# Patient Record
Sex: Male | Born: 1965
Health system: Southern US, Community
[De-identification: ages and names within clinical notes are randomized; demographics above are authoritative.]

## PROBLEM LIST (undated history)

## (undated) DIAGNOSIS — I10 Essential (primary) hypertension: Secondary | ICD-10-CM

## (undated) DIAGNOSIS — F988 Other specified behavioral and emotional disorders with onset usually occurring in childhood and adolescence: Secondary | ICD-10-CM

## (undated) HISTORY — PX: APPENDECTOMY: SHX54

## (undated) HISTORY — DX: Other specified behavioral and emotional disorders with onset usually occurring in childhood and adolescence: F98.8

## (undated) HISTORY — DX: Essential (primary) hypertension: I10

---

## 2003-05-26 ENCOUNTER — Ambulatory Visit (HOSPITAL_COMMUNITY): Admission: RE | Admit: 2003-05-26 | Discharge: 2003-05-26 | Payer: Self-pay | Admitting: Gastroenterology

## 2005-02-18 ENCOUNTER — Encounter: Admission: RE | Admit: 2005-02-18 | Discharge: 2005-02-18 | Payer: Self-pay | Admitting: Internal Medicine

## 2005-02-22 ENCOUNTER — Encounter: Admission: RE | Admit: 2005-02-22 | Discharge: 2005-02-22 | Payer: Self-pay | Admitting: Internal Medicine

## 2005-03-13 ENCOUNTER — Encounter: Admission: RE | Admit: 2005-03-13 | Discharge: 2005-04-02 | Payer: Self-pay | Admitting: Internal Medicine

## 2006-08-27 ENCOUNTER — Ambulatory Visit: Payer: Self-pay | Admitting: Pulmonary Disease

## 2008-05-02 ENCOUNTER — Ambulatory Visit: Payer: Self-pay | Admitting: Internal Medicine

## 2008-06-27 ENCOUNTER — Ambulatory Visit: Payer: Self-pay | Admitting: Internal Medicine

## 2008-07-11 ENCOUNTER — Ambulatory Visit: Payer: Self-pay | Admitting: Internal Medicine

## 2008-08-07 ENCOUNTER — Ambulatory Visit: Payer: Self-pay | Admitting: Internal Medicine

## 2008-09-25 ENCOUNTER — Ambulatory Visit: Payer: Self-pay | Admitting: Internal Medicine

## 2008-10-10 ENCOUNTER — Ambulatory Visit: Payer: Self-pay | Admitting: Internal Medicine

## 2009-03-15 ENCOUNTER — Ambulatory Visit: Payer: Self-pay | Admitting: Internal Medicine

## 2009-04-19 ENCOUNTER — Ambulatory Visit: Payer: Self-pay | Admitting: Internal Medicine

## 2009-10-11 ENCOUNTER — Ambulatory Visit: Payer: Self-pay | Admitting: Internal Medicine

## 2010-06-01 ENCOUNTER — Encounter: Payer: Self-pay | Admitting: Gastroenterology

## 2010-09-27 NOTE — Op Note (Signed)
NAME:  Dominic Garner, Dominic Garner                         ACCOUNT NO.:  [id number]   MEDICAL RECORD NO.:  [id number]                   PATIENT TYPE:  AMB   LOCATION:  ENDO                                 FACILITY:  [facility]   PHYSICIAN:  Anselmo Rod, M.D.               DATE OF BIRTH:  [birth year]   DATE OF PROCEDURE:  05/26/2003  DATE OF DISCHARGE:                                 OPERATIVE REPORT   PROCEDURE:  Colonoscopy.   ENDOSCOPIST:  Anselmo Rod, M.D.   INSTRUMENT USED:  Olympus video colonoscope.   INDICATIONS FOR PROCEDURE:  A 45 year old white male with a questionable  history of diverticulitis and inflammatory changes in the descending colon,  a CT to rule out colitis.   PREPROCEDURE PREPARATION:  Informed consent was procured from the patient.  The patient fasted for eight hours prior to the procedure and prepped with a  bottle of magnesium citrate and a gallon of GoLYTELY the night prior to the  procedure.   PREPROCEDURE PHYSICAL:  The patient had stable vital signs. Neck supple.  Chest clear to auscultation. S1, S2 regular. Abdomen soft with normal bowel  sounds.   DESCRIPTION OF PROCEDURE:  The patient was placed in the left lateral  decubitus position, sedated with 70 mg of Demerol and 7 mg of Versed  intravenously.  Once the patient was adequately sedated and maintained on  low flow oxygen and continuous cardiac monitoring, the Olympus video  colonoscope was advanced from the rectum to the cecum without difficulty. No  masses, polyps, erosions, ulcerations or diverticula were seen. The  appendiceal orifice and ileocecal valve were clearly visualized before we  did retroflexion in the rectum revealed no abnormalities.   IMPRESSION:  Normal colonoscopy of the cecum.  No masses or polyps seen. No  evidence of diverticulosis.   RECOMMENDATIONS:  1. Continue on high fiber diet with liberal fluid intake.  2. Repeat colorectal cancer screening at the age of 8.  3.  Outpatient followup as need arises in the future.                                               Anselmo Rod, M.D.    JNM/MEDQ  D:  05/26/2003  T:  05/26/2003  Job:  [id number]

## 2010-09-27 NOTE — Assessment & Plan Note (Signed)
[facility]                             PULMONARY OFFICE NOTE   NAME:Stopa, Dominic Garner                      MRN:          [id number]  DATE:08/27/2006                            DOB:          01-Jun-1965    SLEEP MEDICINE CONSULTATION   HISTORY OF PRESENT ILLNESS:  The patient is a very pleasant 45 year old  gentleman whom I have been asked to see for snoring and possible sleep  apnea.  The patient states that his wife has complained of snoring and  pauses in his breathing during sleep.  The patient typically gets to bed  between 10:30 and 11:30, and gets up at 7 to 8 a.m. to start his day.  He feels very rested upon arising.  The patient works as an Camera operator  and denies any alertness issues during the day.  He denies any  sleepiness with TV or movies, and no sleepiness with driving.  He is  very satisfied with his degree of alertness.  However, all of this could  be somewhat masked by him taking Adderall for ADD.  The patient states  that his weight is up about 5 pounds over the last 2 years.   PAST MEDICAL HISTORY:  Significant for appendectomy in 1987.  Otherwise,  is unremarkable.   CURRENT MEDICATIONS:  1. Adderall 20 mg 1 daily and p.r.n.  2. Multivitamin daily.  3. Prilosec over-the-counter daily p.r.n.   The patient is allergic to PENICILLIN.   SOCIAL HISTORY:  He is married.  He does not smoke.   FAMILY HISTORY:  Noncontributory in first degree relatives.   REVIEW OF SYSTEMS:  As per the history of present illness.  Also see  patient intake form documented in the chart.   PHYSICAL EXAM:  GENERAL:  He is an overweight male in no acute distress.  Blood pressure is 116/80, pulse 103, temperature 97.9, weight is 239  pounds.  He is 6 feet tall.  O2 saturation on room air is 96%.  HEENT:  Pupils are equal, round, and reactive to light and  accommodation.  Extraocular muscles are intact.  Nares show mild septal  deviation to the left  with no purulence.  Oropharynx does show a very  large left tonsil with a normal right and moderate elongation of the  soft palate and uvula.  NECK:  Supple without JVD or lymphadenopathy.  There is no palpable  thyromegaly.  CHEST:  Totally clear.  CARDIAC:  Regular rate and rhythm.  No murmurs, rubs, or gallops.  ABDOMEN:  Soft and nontender.  Nondistended.  Good bowel sounds.  GENITAL, RECTAL, AND BREASTS:  Not done and not indicated.  LOWER EXTREMITIES:  Without edema.  Pulses intact distally.  NEUROLOGIC:  Alert and oriented with no obvious motor deficits.   IMPRESSION:  Snoring versus the possibility of obstructive sleep apnea.  I suspect from the patient's history, even if he does have sleep apnea,  it is probably extremely mild, and therefore, not a major cardiovascular  risk factor for him.  The patient also has no underlying comorbid  diseases.  I have  gone over the difference with the patient between  snoring and sleep apnea, and how with treat the various entities.  I  really think, at this point in time, because he is not overly  symptomatic during the day, that he should work aggressively on weight  loss, and lets take the next 6 months to see how his symptoms respond to  this.  If his wife feels very strongly about his snoring and wishes  something to be done at this moment, the patient can consider  tonsillectomy with uvulopalatopharyngoplasty and possible nasal septal  reconstruction.  The patient will discuss this with his wife and get  some idea as to the urgency of her complaint with regard to his snoring.  Overall, he feels like he would like to take the more conservative  approach.   PLAN:  1. Work aggressively on weight loss over the next 6 months and stay      off his back as much as possible.  2. The patient is to call me if his wife is really complaining about      the snoring, and he wants to pursue a more aggressive course.  3. The patient is to certainly  call me if he has increasing symptoms      with regard to how he is resting and how he feels the next day.  4. The patient is having a lot of nasal and throat symptoms related to      a recent possible sinus infection versus significant rhinitis.  At      this point in time, I would like to work on nasal hygiene and see      if this will help him as well.  We will go ahead and start Veramyst      2 sprays in each nostril b.i.d. initially, and then bring down to      daily p.r.n.  The patient will also try over-the-counter Zyrtec 1      tablet at bedtime to see how things go.     Barbaraann Share, MD,FCCP  Electronically Signed    [facility]/MedQ  DD: 08/27/2006  DT: 08/27/2006  Job #: [id number]   cc:   Luanna Cole. Lenord Fellers, M.D.

## 2011-05-02 ENCOUNTER — Encounter: Payer: Self-pay | Admitting: Internal Medicine

## 2011-05-02 ENCOUNTER — Ambulatory Visit (INDEPENDENT_AMBULATORY_CARE_PROVIDER_SITE_OTHER): Payer: 59 | Admitting: Internal Medicine

## 2011-05-02 VITALS — BP 124/94 | HR 84 | Temp 97.2°F | Wt 244.0 lb

## 2011-05-02 DIAGNOSIS — Z23 Encounter for immunization: Secondary | ICD-10-CM

## 2011-05-02 DIAGNOSIS — I10 Essential (primary) hypertension: Secondary | ICD-10-CM

## 2011-05-02 DIAGNOSIS — S335XXA Sprain of ligaments of lumbar spine, initial encounter: Secondary | ICD-10-CM

## 2011-05-02 NOTE — Progress Notes (Signed)
  Subjective:    Patient ID: Dominic Garner, male    DOB: Apr 03, 1966, 45 y.o.   MRN: [id number]  HPI patient awakened this morning with back pain so severe that he really could not get out of bed. His wife who is had breast cancer had some dialogue it and Robaxin which she gave him. After taking those 2 medications he was able to get up and get out of bed but he is moving very slowly. Doesn't recall any heavy lifting but does work as an Public relations account executive at [employer]. Has had back issues in the past. Has seen chiropractor in the past. Says pain is mostly in bilateral lumbar area worse on the left than the right. No radiculopathy.  Also has history of hypertension but has not been taking antihypertensive medication. Have advised him to purchase home blood pressure cuff with large cuff and keep some readings. Can return in 4 weeks or so. Probably is due for physical examination.  Has not had influenza immunization. He received one today.      Review of Systems     Objective:   Physical Exam straight leg raising at 90 shows a considerable amount of spasm but no pain in the lumbar area with raising legs at 90 bilaterally. Deep tendon reflexes 1+ and symmetrical in the knees. Muscle strength is 5 over 5 in the lower extremities.        Assessment & Plan:  Low back strain  History of hypertension now off antihypertensive medication  Plan: He will purchase home blood pressure monitor and watch his blood pressure at home for several weeks. Prescribed Sterapred DS 10 mg 6 day dosepak. Flexeril 10 mg 1/2-1 by mouth each bedtime #30 with refills. Lorcet 10/650 (#60) 1 by mouth every 6 hours when necessary pain no refill

## 2011-05-02 NOTE — Patient Instructions (Signed)
Take Flexeril at bedtime for muscle spasm in back. Start on prednisone dosepak for inflammation and lower back. Take pain medication sparingly for as needed. Monitor your blood pressure at home. Return in 4 weeks for followup on hypertension.

## 2011-06-10 ENCOUNTER — Other Ambulatory Visit: Payer: Self-pay | Admitting: Internal Medicine

## 2011-06-10 ENCOUNTER — Other Ambulatory Visit: Payer: 59 | Admitting: Internal Medicine

## 2011-06-10 DIAGNOSIS — Z0000 Encounter for general adult medical examination without abnormal findings: Secondary | ICD-10-CM

## 2011-06-10 LAB — COMPREHENSIVE METABOLIC PANEL
ALT: 28 U/L (ref 0–53)
AST: 24 U/L (ref 0–37)
Albumin: 4.6 g/dL (ref 3.5–5.2)
Alkaline Phosphatase: 48 U/L (ref 39–117)
BUN: 16 mg/dL (ref 6–23)
CO2: 23 mEq/L (ref 19–32)
Calcium: 9.3 mg/dL (ref 8.4–10.5)
Chloride: 104 mEq/L (ref 96–112)
Creat: 0.9 mg/dL (ref 0.50–1.35)
Glucose, Bld: 117 mg/dL — ABNORMAL HIGH (ref 70–99)
Potassium: 4.7 mEq/L (ref 3.5–5.3)
Sodium: 139 mEq/L (ref 135–145)
Total Bilirubin: 1.1 mg/dL (ref 0.3–1.2)
Total Protein: 7.3 g/dL (ref 6.0–8.3)

## 2011-06-10 LAB — CBC WITH DIFFERENTIAL/PLATELET
Basophils Absolute: 0.1 10*3/uL (ref 0.0–0.1)
Basophils Relative: 1 % (ref 0–1)
Eosinophils Absolute: 0.1 10*3/uL (ref 0.0–0.7)
Eosinophils Relative: 2 % (ref 0–5)
HCT: 45.1 % (ref 39.0–52.0)
Hemoglobin: 15 g/dL (ref 13.0–17.0)
Lymphocytes Relative: 44 % (ref 12–46)
Lymphs Abs: 2.6 10*3/uL (ref 0.7–4.0)
MCH: 29.8 pg (ref 26.0–34.0)
MCHC: 33.3 g/dL (ref 30.0–36.0)
MCV: 89.5 fL (ref 78.0–100.0)
Monocytes Absolute: 0.6 10*3/uL (ref 0.1–1.0)
Monocytes Relative: 10 % (ref 3–12)
Neutro Abs: 2.6 10*3/uL (ref 1.7–7.7)
Neutrophils Relative %: 44 % (ref 43–77)
Platelets: 334 10*3/uL (ref 150–400)
RBC: 5.04 MIL/uL (ref 4.22–5.81)
RDW: 14.2 % (ref 11.5–15.5)
WBC: 5.9 10*3/uL (ref 4.0–10.5)

## 2011-06-10 LAB — LIPID PANEL
Cholesterol: 166 mg/dL (ref 0–200)
HDL: 46 mg/dL (ref >39)
LDL Cholesterol: 106 mg/dL — ABNORMAL HIGH (ref 0–99)
Total CHOL/HDL Ratio: 3.6 Ratio
Triglycerides: 72 mg/dL (ref <150)
VLDL: 14 mg/dL (ref 0–40)

## 2011-06-12 ENCOUNTER — Ambulatory Visit (INDEPENDENT_AMBULATORY_CARE_PROVIDER_SITE_OTHER): Payer: 59 | Admitting: Internal Medicine

## 2011-06-12 ENCOUNTER — Encounter: Payer: Self-pay | Admitting: Internal Medicine

## 2011-06-12 VITALS — BP 134/86 | HR 100 | Temp 97.8°F | Ht 71.5 in | Wt 247.0 lb

## 2011-06-12 DIAGNOSIS — I10 Essential (primary) hypertension: Secondary | ICD-10-CM

## 2011-06-12 DIAGNOSIS — Z0000 Encounter for general adult medical examination without abnormal findings: Secondary | ICD-10-CM

## 2011-06-12 DIAGNOSIS — M545 Low back pain, unspecified: Secondary | ICD-10-CM

## 2011-06-12 DIAGNOSIS — E669 Obesity, unspecified: Secondary | ICD-10-CM

## 2011-06-12 LAB — POCT URINALYSIS DIPSTICK
Bilirubin, UA: NEGATIVE
Blood, UA: NEGATIVE
Glucose, UA: NEGATIVE
Ketones, UA: NEGATIVE
Leukocytes, UA: NEGATIVE
Nitrite, UA: NEGATIVE
Protein, UA: NEGATIVE
Spec Grav, UA: 1.025
Urobilinogen, UA: NEGATIVE
pH, UA: 6

## 2011-06-12 LAB — HEMOGLOBIN A1C
Hgb A1c MFr Bld: 5.9 % — ABNORMAL HIGH (ref <5.7)
Mean Plasma Glucose: 123 mg/dL — ABNORMAL HIGH (ref <117)

## 2011-06-12 LAB — PSA: PSA: 0.59 ng/mL (ref <=4.00)

## 2011-08-10 ENCOUNTER — Encounter: Payer: Self-pay | Admitting: Internal Medicine

## 2011-08-10 DIAGNOSIS — M545 Low back pain, unspecified: Secondary | ICD-10-CM | POA: Insufficient documentation

## 2011-08-10 NOTE — Patient Instructions (Signed)
Please take [place] real 10 mg daily on a regular basis. Return in 6 months.

## 2011-08-10 NOTE — Progress Notes (Signed)
  Subjective:    Patient ID: Dominic Garner, male    DOB: April 01, 1966, 46 y.o.   MRN: [id number]  HPIPleasant 41 her old white male with history of hypertension and recurrent low back pain as well as some attention deficit issues. He's here for health maintenance exam. He had stopped taking Altace for while but blood pressure became elevated once again particularly when wife was diagnosed with recurrent left breast cancer. He is a bit overweight. Needs to diet exercise and lose weight. Had an appendectomy in the 1980s. Influenza immunization given December 2012 when he had an episode of severe back pain. This was mainly spasm. Has not thought to have lumbar disc disease. Penicillin causes a rash.  Family history father with history of kidney stones. Mother and 2 sisters in good health. No children.   Review of Systems noncontributory except for hypertension and recurrent low back pain     Objective:   Physical Exam  Vitals reviewed. Constitutional: He is oriented to person, place, and time. He appears well-developed and well-nourished. No distress.  HENT:  Head: Normocephalic and atraumatic.  Right Ear: External ear normal.  Left Ear: External ear normal.  Mouth/Throat: Oropharynx is clear and moist. No oropharyngeal exudate.  Eyes: Conjunctivae are normal. Pupils are equal, round, and reactive to light. Right eye exhibits no discharge. Left eye exhibits no discharge. No scleral icterus.  Neck: Neck supple. No JVD present. No thyromegaly present.  Cardiovascular: Normal rate, regular rhythm, normal heart sounds and intact distal pulses.   No murmur heard. Pulmonary/Chest: Effort normal and breath sounds normal. He has no wheezes. He has no rales.  Abdominal: He exhibits no distension and no mass. There is no tenderness. There is no rebound and no guarding.  Genitourinary: Rectum normal and prostate normal.  Musculoskeletal: Normal range of motion. He exhibits no edema.       No back pain  at present.  Lymphadenopathy:    He has no cervical adenopathy.  Neurological: He is alert and oriented to person, place, and time. He has normal reflexes. He displays normal reflexes. No cranial nerve deficit. Coordination normal.  Skin: Skin is warm and dry. No rash noted. He is not diaphoretic.  Psychiatric: He has a normal mood and affect. His behavior is normal. Judgment and thought content normal.          Assessment & Plan:  Hypertension  Obesity  Recurrent back pain  Plan: Return in 6 months

## 2012-01-02 ENCOUNTER — Encounter: Payer: Self-pay | Admitting: Internal Medicine

## 2012-01-02 ENCOUNTER — Ambulatory Visit (INDEPENDENT_AMBULATORY_CARE_PROVIDER_SITE_OTHER): Payer: BC Managed Care – PPO | Admitting: Internal Medicine

## 2012-01-02 VITALS — BP 128/90 | HR 72 | Temp 97.7°F | Wt 244.5 lb

## 2012-01-02 DIAGNOSIS — R7309 Other abnormal glucose: Secondary | ICD-10-CM

## 2012-01-02 DIAGNOSIS — R635 Abnormal weight gain: Secondary | ICD-10-CM

## 2012-01-02 DIAGNOSIS — R7302 Impaired glucose tolerance (oral): Secondary | ICD-10-CM

## 2012-01-02 DIAGNOSIS — E669 Obesity, unspecified: Secondary | ICD-10-CM

## 2012-01-02 DIAGNOSIS — R7301 Impaired fasting glucose: Secondary | ICD-10-CM

## 2012-01-02 DIAGNOSIS — I10 Essential (primary) hypertension: Secondary | ICD-10-CM

## 2012-01-02 LAB — BASIC METABOLIC PANEL
BUN: 16 mg/dL (ref 6–23)
CO2: 25 mEq/L (ref 19–32)
Calcium: 9.3 mg/dL (ref 8.4–10.5)
Chloride: 105 mEq/L (ref 96–112)
Creat: 0.89 mg/dL (ref 0.50–1.35)
Glucose, Bld: 107 mg/dL — ABNORMAL HIGH (ref 70–99)
Potassium: 4.6 mEq/L (ref 3.5–5.3)
Sodium: 141 mEq/L (ref 135–145)

## 2012-01-02 LAB — HEMOGLOBIN A1C
Hgb A1c MFr Bld: 5.7 % — ABNORMAL HIGH (ref <5.7)
Mean Plasma Glucose: 117 mg/dL — ABNORMAL HIGH (ref <117)

## 2012-01-02 LAB — TSH: TSH: 1.607 u[IU]/mL (ref 0.350–4.500)

## 2012-01-10 DIAGNOSIS — E669 Obesity, unspecified: Secondary | ICD-10-CM | POA: Insufficient documentation

## 2012-01-10 DIAGNOSIS — R7302 Impaired glucose tolerance (oral): Secondary | ICD-10-CM | POA: Insufficient documentation

## 2012-01-10 NOTE — Patient Instructions (Addendum)
Continue Altace for hypertension. Watch diet more strictly and try to get exercise. Try to lose 20 pounds in the next 6 months. Hemoglobin A1c checked today for impaired glucose tolerance history

## 2012-01-10 NOTE — Progress Notes (Signed)
  Subjective:    Patient ID: Dominic Garner, male    DOB: 1966/03/28, 46 y.o.   MRN: [id number]  HPI 46 year old white male in today for six-month followup on hypertension and impaired glucose tolerance. Currently glucose intolerance is being treated by diet. Doesn't get much exercise. He enjoys woodworking and makes some beautiful vases from wood. Wife has history of breast cancer and that has been stressful.   She chose to have unaffected breast removed recently because she did not want to take a chance on having cancer in that breast having had recurrence of cancer in one breast. She also had reconstructive surgery. 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  No side effects with Altace.    Review of Systems     Objective:   Physical Exam obese white male in no acute distress. Chest clear to auscultation. Neck is supple without thyromegaly or carotid bruits. Cardiac exam: Regular rate and rhythm normal S1 and S2; extremities without edema.        Assessment & Plan:  Obesity-encouraged diet and exercise. Suggested patient lose 20 pounds in the next 6 months  Impaired glucose tolerance-hemoglobin A1c checked today  Hypertension-stable on Altace  Situational stress  Plan: Return in 6 months for physical examination. Continue ACE inhibitor for hypertension.

## 2012-03-11 ENCOUNTER — Other Ambulatory Visit: Payer: Self-pay

## 2012-03-11 MED ORDER — RAMIPRIL 10 MG PO TABS: 10.0000 mg | ORAL_TABLET | Freq: Every day | ORAL | Status: DC

## 2012-03-12 ENCOUNTER — Telehealth: Payer: Self-pay | Admitting: Internal Medicine

## 2012-03-12 DIAGNOSIS — J069 Acute upper respiratory infection, unspecified: Secondary | ICD-10-CM

## 2012-03-12 NOTE — Telephone Encounter (Signed)
Dominic Garner is here today with an upper respiratory infection. Husband has similar illness. [name] says that he is reluctant to come to the office today. Written prescription for him for Zithromax Z-Pak take 2 tablets day one followed by 1 tablet days 2 through 5. He is allergic to penicillin supposedly.

## 2012-03-16 ENCOUNTER — Telehealth: Payer: Self-pay | Admitting: Internal Medicine

## 2012-03-16 NOTE — Telephone Encounter (Signed)
Erroneous telephone entry. No message, no call.

## 2012-06-15 ENCOUNTER — Other Ambulatory Visit: Payer: BC Managed Care – PPO | Admitting: Internal Medicine

## 2012-06-15 ENCOUNTER — Encounter: Payer: BC Managed Care – PPO | Admitting: Internal Medicine

## 2012-06-15 DIAGNOSIS — R7301 Impaired fasting glucose: Secondary | ICD-10-CM

## 2012-06-15 DIAGNOSIS — Z0000 Encounter for general adult medical examination without abnormal findings: Secondary | ICD-10-CM

## 2012-06-15 LAB — COMPREHENSIVE METABOLIC PANEL
ALT: 24 U/L (ref 0–53)
AST: 20 U/L (ref 0–37)
Albumin: 4.4 g/dL (ref 3.5–5.2)
Alkaline Phosphatase: 36 U/L — ABNORMAL LOW (ref 39–117)
BUN: 10 mg/dL (ref 6–23)
CO2: 30 mEq/L (ref 19–32)
Calcium: 9.5 mg/dL (ref 8.4–10.5)
Chloride: 103 mEq/L (ref 96–112)
Creat: 0.96 mg/dL (ref 0.50–1.35)
Glucose, Bld: 117 mg/dL — ABNORMAL HIGH (ref 70–99)
Potassium: 4.7 mEq/L (ref 3.5–5.3)
Sodium: 139 mEq/L (ref 135–145)
Total Bilirubin: 1.2 mg/dL (ref 0.3–1.2)
Total Protein: 7.5 g/dL (ref 6.0–8.3)

## 2012-06-15 LAB — CBC WITH DIFFERENTIAL/PLATELET
Basophils Absolute: 0.1 10*3/uL (ref 0.0–0.1)
Basophils Relative: 1 % (ref 0–1)
Eosinophils Absolute: 0.1 10*3/uL (ref 0.0–0.7)
Eosinophils Relative: 2 % (ref 0–5)
HCT: 45.6 % (ref 39.0–52.0)
Hemoglobin: 15.6 g/dL (ref 13.0–17.0)
Lymphocytes Relative: 43 % (ref 12–46)
Lymphs Abs: 2.3 10*3/uL (ref 0.7–4.0)
MCH: 30 pg (ref 26.0–34.0)
MCHC: 34.2 g/dL (ref 30.0–36.0)
MCV: 87.7 fL (ref 78.0–100.0)
Monocytes Absolute: 0.5 10*3/uL (ref 0.1–1.0)
Monocytes Relative: 10 % (ref 3–12)
Neutro Abs: 2.4 10*3/uL (ref 1.7–7.7)
Neutrophils Relative %: 44 % (ref 43–77)
Platelets: 257 10*3/uL (ref 150–400)
RBC: 5.2 MIL/uL (ref 4.22–5.81)
RDW: 13.4 % (ref 11.5–15.5)
WBC: 5.3 10*3/uL (ref 4.0–10.5)

## 2012-06-15 LAB — HEMOGLOBIN A1C
Hgb A1c MFr Bld: 5.8 % — ABNORMAL HIGH (ref <5.7)
Mean Plasma Glucose: 120 mg/dL — ABNORMAL HIGH (ref <117)

## 2012-06-15 LAB — LIPID PANEL
Cholesterol: 178 mg/dL (ref 0–200)
HDL: 46 mg/dL (ref >39)
LDL Cholesterol: 113 mg/dL — ABNORMAL HIGH (ref 0–99)
Total CHOL/HDL Ratio: 3.9 Ratio
Triglycerides: 96 mg/dL (ref <150)
VLDL: 19 mg/dL (ref 0–40)

## 2012-06-15 LAB — PSA: PSA: 0.7 ng/mL (ref <=4.00)

## 2012-07-12 ENCOUNTER — Encounter: Payer: Self-pay | Admitting: Internal Medicine

## 2012-07-12 ENCOUNTER — Ambulatory Visit (INDEPENDENT_AMBULATORY_CARE_PROVIDER_SITE_OTHER): Payer: BC Managed Care – PPO | Admitting: Internal Medicine

## 2012-07-12 VITALS — BP 136/90 | HR 84 | Temp 97.4°F | Ht 72.0 in | Wt 246.0 lb

## 2012-07-12 DIAGNOSIS — R7302 Impaired glucose tolerance (oral): Secondary | ICD-10-CM

## 2012-07-12 DIAGNOSIS — I10 Essential (primary) hypertension: Secondary | ICD-10-CM

## 2012-07-12 DIAGNOSIS — Z0000 Encounter for general adult medical examination without abnormal findings: Secondary | ICD-10-CM

## 2012-07-12 DIAGNOSIS — Z8739 Personal history of other diseases of the musculoskeletal system and connective tissue: Secondary | ICD-10-CM

## 2012-07-12 DIAGNOSIS — E8881 Metabolic syndrome: Secondary | ICD-10-CM

## 2012-07-12 DIAGNOSIS — R7309 Other abnormal glucose: Secondary | ICD-10-CM

## 2012-07-12 DIAGNOSIS — E669 Obesity, unspecified: Secondary | ICD-10-CM

## 2012-07-12 LAB — POCT URINALYSIS DIPSTICK
Bilirubin, UA: NEGATIVE
Blood, UA: NEGATIVE
Glucose, UA: NEGATIVE
Ketones, UA: NEGATIVE
Leukocytes, UA: NEGATIVE
Nitrite, UA: NEGATIVE
Protein, UA: NEGATIVE
Spec Grav, UA: 1.02
Urobilinogen, UA: NEGATIVE
pH, UA: 6

## 2012-07-13 DIAGNOSIS — E8881 Metabolic syndrome: Secondary | ICD-10-CM | POA: Insufficient documentation

## 2012-07-13 NOTE — Patient Instructions (Addendum)
We will arrange for you to see dietitian. Return in 6 months. In the meantime diet exercise and lose weight

## 2012-07-13 NOTE — Progress Notes (Signed)
  Subjective:    Patient ID: Dominic Garner, male    DOB: Mar 27, 1966, 47 y.o.   MRN: [id number]  HPI 47 year old white male with history of hypertension and obesity in today for health maintenance and evaluation of medical problems. History of low back pain from time to time. Patient is overweight at 246 pounds. He has not lost any weight in the past 2 years. He really doesn't exercise regularly. He enjoys woodworking. He does take some Prilosec over-the-counter for GE reflux. His been on Ramipril for some time for hypertension. Hemoglobin A1c is 5.8% in one year ago was 5.9%. PSA is within normal limits. Family history of prostate cancer in his grandfather. LDL cholesterol was 113 and was 106 last year. He really needs to get serious about diet and exercise. He is willing to see a nutritionist.  He had an appendectomy in the 1980s. Penicillin causes a rash.  Family history: Father with history of kidney stones. Mother and 2 sisters in good health. No children.  Does not smoke. Occasional social alcohol consumption.    Review of Systems noncontributory except for hypertension and low back pain     Objective:   Physical Exam  Vitals reviewed. Constitutional: He is oriented to person, place, and time. No distress.  HENT:  Head: Normocephalic and atraumatic.  Right Ear: External ear normal.  Left Ear: External ear normal.  Mouth/Throat: Oropharynx is clear and moist. No oropharyngeal exudate.  Eyes: Conjunctivae and EOM are normal. Pupils are equal, round, and reactive to light. Right eye exhibits no discharge. Left eye exhibits no discharge. No scleral icterus.  Neck: Neck supple. No JVD present. No thyromegaly present.  Cardiovascular: Normal rate, regular rhythm, normal heart sounds and intact distal pulses.   No murmur heard. Pulmonary/Chest: He has no wheezes.  Abdominal: Soft. Bowel sounds are normal. He exhibits no distension and no mass. There is no tenderness. There is no rebound  and no guarding.  Genitourinary: Prostate normal.  Musculoskeletal: He exhibits no edema.  Lymphadenopathy:    He has no cervical adenopathy.  Neurological: He is alert and oriented to person, place, and time. He has normal reflexes. No cranial nerve deficit.  Skin: Skin is warm and dry. No rash noted. He is not diaphoretic.  Psychiatric: He has a normal mood and affect. His behavior is normal. Judgment and thought content normal.          Assessment & Plan:  Hypertension  Obesity  Impaired glucose tolerance  History of low back pain  Plan: Encouraged diet and exercise. Nutrition consult. Return in 6 months for office visit, blood pressure check, and hemoglobin A1c

## 2012-11-18 ENCOUNTER — Other Ambulatory Visit: Payer: BC Managed Care – PPO | Admitting: Internal Medicine

## 2012-11-18 DIAGNOSIS — R7301 Impaired fasting glucose: Secondary | ICD-10-CM

## 2012-11-18 LAB — HEMOGLOBIN A1C
Hgb A1c MFr Bld: 5.5 % (ref <5.7)
Mean Plasma Glucose: 111 mg/dL (ref <117)

## 2012-11-22 ENCOUNTER — Encounter: Payer: Self-pay | Admitting: Internal Medicine

## 2012-11-22 ENCOUNTER — Ambulatory Visit (INDEPENDENT_AMBULATORY_CARE_PROVIDER_SITE_OTHER): Payer: BC Managed Care – PPO | Admitting: Internal Medicine

## 2012-11-22 VITALS — BP 144/90 | HR 80 | Temp 97.7°F | Wt 243.0 lb

## 2012-11-22 DIAGNOSIS — I10 Essential (primary) hypertension: Secondary | ICD-10-CM

## 2012-11-22 DIAGNOSIS — R7309 Other abnormal glucose: Secondary | ICD-10-CM

## 2012-11-22 DIAGNOSIS — R7302 Impaired glucose tolerance (oral): Secondary | ICD-10-CM

## 2012-11-22 NOTE — Patient Instructions (Addendum)
Take Altace 5 mg daily and return in 4 weeks ---continue diet exercise

## 2012-11-22 NOTE — Progress Notes (Signed)
  Subjective:    Patient ID: Dominic Garner, male    DOB: 1965-09-29, 47 y.o.   MRN: [id number]  HPI    In today to followup on hypertension and glucose intolerance. Hemoglobin A1c has decreased 5.5% with diet and exercise alone. She's lost 3 pounds since March. He's been exercising more and watching his diet. He's riding his bicycle 11 miles at a time and running 3 miles at a time.  However, he quit taking antihypertensive medication because he thought his blood pressure had normalized. Gets up today. Last week he was in [place] taking care of sick parents. Mother  had pancreatitis and father had kidney stones.  He is afraid that if he goes back to Altace 10 mg daily blood pressure will lead to lobe. Therefore we've agreed he will take 5 mg Altace daily. He has no side effects with this medication.   Review of Systems     Objective:   Physical Exam blood pressure 140/90 today   Not examined. Hemoglobin A1c reviewed with him. Previous 11 A1c a year ago was 5.9% so he's made considerable progress bring it down 5.5%     Assessment & Plan:  Impaired glucose tolerance  Hypertension  Plan: Start Altace 5 mg daily and return in 4 weeks

## 2012-11-30 ENCOUNTER — Other Ambulatory Visit: Payer: Self-pay

## 2012-11-30 MED ORDER — RAMIPRIL 10 MG PO TABS: 10.0000 mg | ORAL_TABLET | Freq: Every day | ORAL | Status: DC

## 2012-12-27 ENCOUNTER — Encounter: Payer: Self-pay | Admitting: Internal Medicine

## 2012-12-27 ENCOUNTER — Ambulatory Visit (INDEPENDENT_AMBULATORY_CARE_PROVIDER_SITE_OTHER): Payer: BC Managed Care – PPO | Admitting: Internal Medicine

## 2012-12-27 VITALS — BP 128/86 | HR 80 | Wt 242.0 lb

## 2012-12-27 DIAGNOSIS — Z87898 Personal history of other specified conditions: Secondary | ICD-10-CM

## 2012-12-27 DIAGNOSIS — E669 Obesity, unspecified: Secondary | ICD-10-CM

## 2012-12-27 DIAGNOSIS — I10 Essential (primary) hypertension: Secondary | ICD-10-CM

## 2012-12-27 DIAGNOSIS — Z862 Personal history of diseases of the blood and blood-forming organs and certain disorders involving the immune mechanism: Secondary | ICD-10-CM

## 2012-12-27 LAB — BASIC METABOLIC PANEL
BUN: 14 mg/dL (ref 6–23)
CO2: 29 mEq/L (ref 19–32)
Calcium: 9.6 mg/dL (ref 8.4–10.5)
Chloride: 101 mEq/L (ref 96–112)
Creat: 0.93 mg/dL (ref 0.50–1.35)
Glucose, Bld: 113 mg/dL — ABNORMAL HIGH (ref 70–99)
Potassium: 4.7 mEq/L (ref 3.5–5.3)
Sodium: 137 mEq/L (ref 135–145)

## 2012-12-27 NOTE — Progress Notes (Signed)
  Subjective:    Patient ID: Dominic Garner, male    DOB: October 11, 1965, 47 y.o.   MRN: [id number]  HPI  In today to followup on hypertension. At last visit blood pressure was not well controlled. He has not been very compliant with Altace 10 mg daily at that time. Note says that week gave him Altase 5 mg daily at the last visit but apparently his been taking 10 mg daily according to medication orders. His blood pressure is quite good today. He has not been able to exercise much recently because he pulled left calf muscle while exercising.  Hemoglobin A1c recently checked was 5.5%. He has a history of impaired glucose tolerance. He needs to regular diet exercise and lose weight.     Review of Systems     Objective:   Physical Exam  chest clear to auscultation. Cardiac exam regular rate and rhythm normal S1 and S2. Extremities without edema.       Assessment & Plan:   hypertension  History  This tolerance  Obesity  Plan: Continue Altace 10 mg daily. Basic metabolic panel drawn today. He will continue to monitor his blood pressure at home. He will return in mid March 2015 for physical exam. He will call if blood pressures not well controlled.

## 2012-12-27 NOTE — Patient Instructions (Addendum)
Continue Altace 10 mg daily. Basic metabolic panel drawn today. Return in mid March 2015 for physical exam. Continue to monitor your blood pressure at home . Continue diet and exercise.

## 2013-01-03 ENCOUNTER — Telehealth: Payer: Self-pay | Admitting: Internal Medicine

## 2013-01-03 DIAGNOSIS — F411 Generalized anxiety disorder: Secondary | ICD-10-CM

## 2013-01-03 DIAGNOSIS — Z0289 Encounter for other administrative examinations: Secondary | ICD-10-CM

## 2013-01-03 MED ORDER — CIPROFLOXACIN HCL 500 MG PO TABS: 500.0000 mg | ORAL_TABLET | Freq: Two times a day (BID) | ORAL | Status: DC

## 2013-01-03 MED ORDER — ALPRAZOLAM 0.5 MG PO TBDP: 0.5000 mg | ORAL_TABLET | Freq: Two times a day (BID) | ORAL | Status: DC | PRN

## 2013-01-03 NOTE — Telephone Encounter (Signed)
Also has requested antibiotics for trip. Call in Cipro 500 mg bid x 10 days for infection. Call in Xanax 0.5 mg 1/2 to one po bid prn anxiety # 60 no refill.

## 2013-01-03 NOTE — Telephone Encounter (Signed)
rx sent to pharmacy

## 2013-05-27 ENCOUNTER — Emergency Department (HOSPITAL_COMMUNITY)
Admission: EM | Admit: 2013-05-27 | Discharge: 2013-05-27 | Disposition: A | Discharge status: Home / Self Care | Payer: BC Managed Care – PPO | Attending: Emergency Medicine | Admitting: Emergency Medicine

## 2013-05-27 ENCOUNTER — Encounter (HOSPITAL_COMMUNITY): Payer: Self-pay | Admitting: Emergency Medicine

## 2013-05-27 DIAGNOSIS — W460XXA Contact with hypodermic needle, initial encounter: Secondary | ICD-10-CM | POA: Insufficient documentation

## 2013-05-27 DIAGNOSIS — S61209A Unspecified open wound of unspecified finger without damage to nail, initial encounter: Secondary | ICD-10-CM | POA: Insufficient documentation

## 2013-05-27 DIAGNOSIS — IMO0002 Reserved for concepts with insufficient information to code with codable children: Secondary | ICD-10-CM

## 2013-05-27 DIAGNOSIS — Y9389 Activity, other specified: Secondary | ICD-10-CM | POA: Insufficient documentation

## 2013-05-27 DIAGNOSIS — Z79899 Other long term (current) drug therapy: Secondary | ICD-10-CM | POA: Insufficient documentation

## 2013-05-27 DIAGNOSIS — I10 Essential (primary) hypertension: Secondary | ICD-10-CM | POA: Insufficient documentation

## 2013-05-27 DIAGNOSIS — Z880 Allergy status to penicillin: Secondary | ICD-10-CM | POA: Insufficient documentation

## 2013-05-27 DIAGNOSIS — Z8659 Personal history of other mental and behavioral disorders: Secondary | ICD-10-CM | POA: Insufficient documentation

## 2013-05-27 DIAGNOSIS — Y990 Civilian activity done for income or pay: Secondary | ICD-10-CM | POA: Insufficient documentation

## 2013-05-27 DIAGNOSIS — Y9289 Other specified places as the place of occurrence of the external cause: Secondary | ICD-10-CM | POA: Insufficient documentation

## 2013-05-27 LAB — HIV RAPID SCREEN (BLD OR BODY FLD EXPOSURE): Rapid HIV Screen: NONREACTIVE

## 2013-05-27 NOTE — Discharge Instructions (Signed)
Follow up with your doctor in 6 weeks to be rechecked for HIV and hepatitis.  Return to ER if you have any more concerns.

## 2013-05-27 NOTE — ED Provider Notes (Signed)
CSN: [id number]     Arrival date & time 05/27/13  1904 History  This chart was scribed for Ruby Colaatherine Shaddai Shapley, PA, working with Gavin PoundMichael Y. Oletta LamasGhim, MD, by Ardelia Memsylan Malpass ED Scribe. This patient was seen in room WTR7/WTR7 and the patient's care was started at 9:16 PM.    Chief Complaint  Patient presents with  . Body Fluid Exposure    The history is provided by the patient. No language interpreter was used.    HPI Comments: Dominic Garner is a 48 y.o. male with a history of HTN who presents to the Emergency Department complaining of an exposure to body fluids that occurred about 3 hours ago. 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 He states that about 3 hours ago, he was taking a subcutaneous patch off of the back of a deceased individual, and he states that the patch had a needle on its underside which pricked his right thumb. He states that he immediately took his gloves off, squeezed blood out of the wound on his right thumb for about 2 minutes, and then washed his hands with alcohol gel. Pt expresses particular concern that the deceased individual was a hospice pt who had necrotizing fascitis in his left leg. He is here today to rule out any serious blood-borne illnesses.   Past Medical History  Diagnosis Date  . Hypertension   . ADD (attention deficit disorder)    Past Surgical History  Procedure Laterality Date  . Appendectomy     History reviewed. No pertinent family history. History  Substance Use Topics  . Smoking status: Never Smoker   . Smokeless tobacco: Never Used  . Alcohol Use: Yes     Comment: socially    Review of Systems  Skin: Positive for wound.  All other systems reviewed and are negative.   Allergies  Penicillins  Home Medications   Current Outpatient Rx  Name  Route  Sig  Dispense  Refill  . ALPRAZolam (NIRAVAM) 0.5 MG dissolvable tablet   Oral   Take 1 tablet (0.5 mg total) by mouth 2 (two) times daily as needed for anxiety.   60  tablet   0   . omeprazole (PRILOSEC) 10 MG capsule   Oral   Take 10 mg by mouth daily.         . ramipril (ALTACE) 10 MG tablet   Oral   Take 1 tablet (10 mg total) by mouth daily.   30 tablet   5    Triage Vitals: BP 132/98  Pulse 105  Temp(Src) 98.4 F (36.9 C) (Oral)  Resp 16  Wt 142 lb (64.411 kg)  SpO2 98%  Physical Exam  Nursing note and vitals reviewed. Constitutional: He is oriented to person, place, and time. He appears well-developed and well-nourished. No distress.  HENT:  Head: Normocephalic and atraumatic.  Eyes:  Normal appearance  Neck: Normal range of motion.  Pulmonary/Chest: Effort normal.  Musculoskeletal: Normal range of motion.  Neurological: He is alert and oriented to person, place, and time.  Skin:  Pinpoint wound on palmar surface of distal phalanx of right thumb.  Hemostatic and clean.  No eccymosis  Non-tender.    Psychiatric: He has a normal mood and affect. His behavior is normal.    ED Course98  Procedures (including critical care time)  DIAGNOSTIC STUDIES: Oxygen Saturation is 98% on RA, normal by my interpretation.    COORDINATION OF CARE: 9:25 PM- Discussed plan to test for blood-borne illnesses.  Pt advised of plan for treatment and pt agrees.  Labs Review Labs Reviewed  HIV RAPID SCREEN (BLD OR BODY FLD EXPOSURE)  HEPATITIS B SURFACE ANTIGEN  HEPATITIS C ANTIBODY (REFLEX)   Imaging Review No results found.  EKG Interpretation   None       MDM   1. 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  Wound is uncomplicated and pt has cleaned extensively.  Post-exposure panel drawn and pt advised to f/u w/ PCP in 6 weeks for recheck.   Based on superficial nature of wound and unknown  HIV and Hepatitis status of deceased patient, post-exposure prophylaxis not indicated.  Pt  reassured.    I personally performed the services described in this documentation, which was scribed in my presence. The recorded information has been reviewed and is accurate.   Arie Sabina Demia Viera, PA-C 05/28/13 1011

## 2013-05-27 NOTE — ED Notes (Signed)
Patient was taking a patch off a dead patient and was stuck with a needle. Ptient washed hands and ETOH gel after exposure and had gloves on

## 2013-05-28 LAB — HEPATITIS C ANTIBODY (REFLEX): HCV Ab: NEGATIVE

## 2013-05-28 LAB — HEPATITIS B SURFACE ANTIGEN: Hepatitis B Surface Ag: NEGATIVE

## 2013-05-28 NOTE — ED Provider Notes (Signed)
Medical screening examination/treatment/procedure(s) were performed by non-physician practitioner and as supervising physician I was immediately available for consultation/collaboration.  EKG Interpretation   None         Gavin PoundMichael Y. Demetric Parslow, MD 05/28/13 1318

## 2013-05-31 ENCOUNTER — Telehealth: Payer: Self-pay

## 2013-05-31 LAB — HEPATITIS B SURFACE ANTIBODY,QUALITATIVE: Hep B S Ab: NEGATIVE

## 2013-06-12 ENCOUNTER — Other Ambulatory Visit: Payer: Self-pay | Admitting: Internal Medicine

## 2013-07-25 ENCOUNTER — Encounter: Payer: Self-pay | Admitting: Internal Medicine

## 2013-07-25 ENCOUNTER — Other Ambulatory Visit: Payer: BC Managed Care – PPO | Admitting: Internal Medicine

## 2013-07-25 ENCOUNTER — Ambulatory Visit (INDEPENDENT_AMBULATORY_CARE_PROVIDER_SITE_OTHER): Payer: BC Managed Care – PPO | Admitting: Internal Medicine

## 2013-07-25 VITALS — BP 134/86 | HR 84 | Temp 97.7°F | Ht 72.0 in | Wt 247.0 lb

## 2013-07-25 DIAGNOSIS — Z13220 Encounter for screening for lipoid disorders: Secondary | ICD-10-CM

## 2013-07-25 DIAGNOSIS — IMO0002 Reserved for concepts with insufficient information to code with codable children: Secondary | ICD-10-CM

## 2013-07-25 DIAGNOSIS — Z0184 Encounter for antibody response examination: Secondary | ICD-10-CM

## 2013-07-25 DIAGNOSIS — R7302 Impaired glucose tolerance (oral): Secondary | ICD-10-CM

## 2013-07-25 DIAGNOSIS — Z7721 Contact with and (suspected) exposure to potentially hazardous body fluids: Secondary | ICD-10-CM

## 2013-07-25 DIAGNOSIS — I10 Essential (primary) hypertension: Secondary | ICD-10-CM

## 2013-07-25 DIAGNOSIS — Z0000 Encounter for general adult medical examination without abnormal findings: Secondary | ICD-10-CM

## 2013-07-25 DIAGNOSIS — R7301 Impaired fasting glucose: Secondary | ICD-10-CM

## 2013-07-25 DIAGNOSIS — R7309 Other abnormal glucose: Secondary | ICD-10-CM

## 2013-07-25 DIAGNOSIS — E669 Obesity, unspecified: Secondary | ICD-10-CM

## 2013-07-25 DIAGNOSIS — Z125 Encounter for screening for malignant neoplasm of prostate: Secondary | ICD-10-CM

## 2013-07-25 DIAGNOSIS — E8881 Metabolic syndrome: Secondary | ICD-10-CM

## 2013-07-25 DIAGNOSIS — Z130 Encounter for screening for diseases of the blood and blood-forming organs and certain disorders involving the immune mechanism: Secondary | ICD-10-CM

## 2013-07-25 DIAGNOSIS — Z79899 Other long term (current) drug therapy: Secondary | ICD-10-CM

## 2013-07-25 LAB — POCT URINALYSIS DIPSTICK
Bilirubin, UA: NEGATIVE
Blood, UA: NEGATIVE
Glucose, UA: NEGATIVE
Ketones, UA: NEGATIVE
Leukocytes, UA: NEGATIVE
Nitrite, UA: NEGATIVE
Protein, UA: NEGATIVE
Spec Grav, UA: 1.025
Urobilinogen, UA: NEGATIVE
pH, UA: 5.5

## 2013-07-25 LAB — CBC WITH DIFFERENTIAL/PLATELET
Basophils Absolute: 0.1 10*3/uL (ref 0.0–0.1)
Basophils Relative: 1 % (ref 0–1)
Eosinophils Absolute: 0.1 10*3/uL (ref 0.0–0.7)
Eosinophils Relative: 2 % (ref 0–5)
HCT: 44.7 % (ref 39.0–52.0)
Hemoglobin: 15.6 g/dL (ref 13.0–17.0)
Lymphocytes Relative: 44 % (ref 12–46)
Lymphs Abs: 2.3 10*3/uL (ref 0.7–4.0)
MCH: 29.9 pg (ref 26.0–34.0)
MCHC: 34.9 g/dL (ref 30.0–36.0)
MCV: 85.6 fL (ref 78.0–100.0)
Monocytes Absolute: 0.6 10*3/uL (ref 0.1–1.0)
Monocytes Relative: 11 % (ref 3–12)
Neutro Abs: 2.2 10*3/uL (ref 1.7–7.7)
Neutrophils Relative %: 42 % — ABNORMAL LOW (ref 43–77)
Platelets: 261 10*3/uL (ref 150–400)
RBC: 5.22 MIL/uL (ref 4.22–5.81)
RDW: 13.8 % (ref 11.5–15.5)
WBC: 5.2 10*3/uL (ref 4.0–10.5)

## 2013-07-25 LAB — COMPREHENSIVE METABOLIC PANEL
ALT: 19 U/L (ref 0–53)
AST: 17 U/L (ref 0–37)
Albumin: 4.5 g/dL (ref 3.5–5.2)
Alkaline Phosphatase: 40 U/L (ref 39–117)
BUN: 18 mg/dL (ref 6–23)
CO2: 28 mEq/L (ref 19–32)
Calcium: 9.5 mg/dL (ref 8.4–10.5)
Chloride: 102 mEq/L (ref 96–112)
Creat: 0.94 mg/dL (ref 0.50–1.35)
Glucose, Bld: 123 mg/dL — ABNORMAL HIGH (ref 70–99)
Potassium: 4.2 mEq/L (ref 3.5–5.3)
Sodium: 138 mEq/L (ref 135–145)
Total Bilirubin: 1.4 mg/dL — ABNORMAL HIGH (ref 0.2–1.2)
Total Protein: 7.1 g/dL (ref 6.0–8.3)

## 2013-07-25 LAB — LIPID PANEL
Cholesterol: 192 mg/dL (ref 0–200)
HDL: 47 mg/dL (ref >39)
LDL Cholesterol: 130 mg/dL — ABNORMAL HIGH (ref 0–99)
Total CHOL/HDL Ratio: 4.1 Ratio
Triglycerides: 73 mg/dL (ref <150)
VLDL: 15 mg/dL (ref 0–40)

## 2013-07-25 MED ORDER — RAMIPRIL 10 MG PO CAPS: ORAL_CAPSULE | ORAL | Status: DC

## 2013-07-25 MED ORDER — HYDROCHLOROTHIAZIDE 25 MG PO TABS: 25.0000 mg | ORAL_TABLET | Freq: Every day | ORAL | Status: DC

## 2013-07-25 NOTE — Addendum Note (Signed)
Addended by: Judy PimpleEILAND, Kati Riggenbach M on: 07/25/2013 10:31 AM   Modules accepted: Orders

## 2013-07-25 NOTE — Patient Instructions (Signed)
Add HCTZ to Altace and return in 4 weeks. Labs for needle stick follow up have been drawn and are pending

## 2013-07-25 NOTE — Progress Notes (Signed)
Subjective:    Patient ID: Dominic Garner, male    DOB: 10-11-1965, 48 y.o.   MRN: [id number]  HPI  48 year old White  Male for health maintenance and evaluation of medical issues. Had needle stick from his employment January 2015.  Needs follow up testing today. Has had Hep B series 2000.  Had repeat  titer  Last Fall and reportedly  was not immune to Hep B and started series over then. Says he has had 2  Doses Hep B thus far. In January 2015 when he went to emergency department with needlestick exposure, he had negative hepatitis C antibody, negative HIV antibody, negative hepatitis B surface antigen, negative hepatitis B surface antibody.  Has not had antihypertensive medication this morning. Says blood pressures been running a little bit high. Has not been able to exercise do to a strained calf muscle.  History of GE reflux which she takes over-the-counter Prilosec.  HEENT history of glucose intolerance. Hemoglobin A1c has been in the 5.8% 5.9% range.  Past medical history: Appendectomy in the 1980s.  Penicillin causes a rash.  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 He is married. No children. Social alcohol consumption. Does not smoke.  Family history: Prostate cancer in his grandfather. Father with history of kidney stones. Mother and 2 sisters in good health.    Review of Systems  Constitutional: Negative.   HENT: Negative.   Eyes: Negative.   Respiratory: Negative.   Cardiovascular: Negative.   Endocrine: Negative.   Genitourinary: Negative.   Musculoskeletal:       History of low back pain from time to time  Allergic/Immunologic: Negative.   Neurological: Negative.   Hematological: Negative.   Psychiatric/Behavioral: Negative.        Objective:   Physical Exam  Vitals reviewed. Constitutional: He is oriented to person, place, and time. He appears well-developed and well-nourished. No distress.  HENT:  Head: Normocephalic and  atraumatic.  Right Ear: External ear normal.  Left Ear: External ear normal.  Nose: Nose normal.  Mouth/Throat: No oropharyngeal exudate.  Eyes: Conjunctivae and EOM are normal. Pupils are equal, round, and reactive to light. Right eye exhibits no discharge. Left eye exhibits no discharge. No scleral icterus.  Neck: Neck supple. No JVD present. No thyromegaly present.  Cardiovascular: Normal rate, regular rhythm, normal heart sounds and intact distal pulses.   No murmur heard. Pulmonary/Chest: Effort normal and breath sounds normal. No respiratory distress. He has no rales.  Abdominal: Soft. Bowel sounds are normal.  Genitourinary: Rectum normal and prostate normal.  Musculoskeletal: Normal range of motion. He exhibits no edema.  Lymphadenopathy:    He has no cervical adenopathy.  Neurological: He is alert and oriented to person, place, and time. He has normal reflexes. He displays normal reflexes. No cranial nerve deficit. He exhibits normal muscle tone. Coordination normal.  Skin: Skin is warm and dry. No rash noted. He is not diaphoretic.  Psychiatric: He has a normal mood and affect. His behavior is normal. Judgment and thought content normal.          Assessment & Plan:  Hypertension-and HCTZ the Altase 10 mg daily and return in 4 weeks for office visit, basic metabolic panel, blood pressure check.  Obesity-needs to diet and exercise  Impaired glucose tolerance-hemoglobin A1c pending  History of low back pain-no complaint of back pain today  Plan: Return in 4 weeks for followup of blood pressure having added HCTZ to  Altace. Patient should take both medications before coming to office.

## 2013-07-26 LAB — PSA: PSA: 0.78 ng/mL (ref <=4.00)

## 2013-07-26 LAB — HEPATITIS B SURFACE ANTIBODY, QUANTITATIVE: Hepatitis B-Post: 0.1 m[IU]/mL

## 2013-07-26 LAB — HEMOGLOBIN A1C
Hgb A1c MFr Bld: 5.8 % — ABNORMAL HIGH (ref <5.7)
Mean Plasma Glucose: 120 mg/dL — ABNORMAL HIGH (ref <117)

## 2013-07-26 LAB — HEPATITIS C ANTIBODY: HCV Ab: NEGATIVE

## 2013-07-26 LAB — HIV ANTIBODY (ROUTINE TESTING W REFLEX): HIV: NONREACTIVE

## 2013-07-27 NOTE — Progress Notes (Signed)
Patient informed. 

## 2013-08-30 ENCOUNTER — Ambulatory Visit (INDEPENDENT_AMBULATORY_CARE_PROVIDER_SITE_OTHER): Payer: BC Managed Care – PPO | Admitting: Internal Medicine

## 2013-08-30 ENCOUNTER — Encounter: Payer: Self-pay | Admitting: Internal Medicine

## 2013-08-30 VITALS — BP 132/90 | HR 92 | Wt 240.0 lb

## 2013-08-30 DIAGNOSIS — J069 Acute upper respiratory infection, unspecified: Secondary | ICD-10-CM

## 2013-08-30 DIAGNOSIS — E669 Obesity, unspecified: Secondary | ICD-10-CM

## 2013-08-30 DIAGNOSIS — R7302 Impaired glucose tolerance (oral): Secondary | ICD-10-CM

## 2013-08-30 DIAGNOSIS — R7309 Other abnormal glucose: Secondary | ICD-10-CM

## 2013-08-30 DIAGNOSIS — I10 Essential (primary) hypertension: Secondary | ICD-10-CM

## 2013-08-30 MED ORDER — LOSARTAN POTASSIUM 100 MG PO TABS: 100.0000 mg | ORAL_TABLET | Freq: Every day | ORAL | Status: DC

## 2013-08-30 NOTE — Patient Instructions (Addendum)
Stop Ramipril. Try Losartan 100 mg daily and continue HCTZ. Return in 4 weeks. Basic metabolic panel to be drawn then

## 2013-08-30 NOTE — Progress Notes (Signed)
   Subjective:    Patient ID: Dominic Garner, male    DOB: 1966/05/07, 48 y.o.   MRN: [id number]  HPI Followup today of hypertension. Was here 4 weeks ago. At that time HCTZ was added to Altace.  B- met drawn today to followup on potassium. Has lost 7 pounds since last visit which is great but blood pressure is still elevated at 132/90 on Altace and HCTZ. Hemoglobin A1c at last visit 5.8%. He says his been eating less. He is working out some. He's had a recent URI. Has been taking decongestants but no decongestant in the past 24 hours. Does not feel he needs an antibiotic.    Review of Systems     Objective:   Physical Exam skin warm and dry. Nodes none. HEENT exam negative. He sounds nasally congested. Chest clear to auscultation. Cardiac exam regular rate and rhythm extremities without edema        Assessment & Plan:  Hypertension  Obesity  Impaired glucose tolerance  Acute URI  Plan: Discontinue Altace. Add losartan 100 mg daily to HCTZ. Return in 4 weeks. At that time he'll need a basic metabolic panel.   25 minutes spent with patient

## 2013-10-06 ENCOUNTER — Encounter: Payer: Self-pay | Admitting: Internal Medicine

## 2013-10-06 ENCOUNTER — Ambulatory Visit (INDEPENDENT_AMBULATORY_CARE_PROVIDER_SITE_OTHER): Payer: BC Managed Care – PPO | Admitting: Internal Medicine

## 2013-10-06 VITALS — BP 126/84 | HR 72 | Temp 98.7°F | Wt 234.0 lb

## 2013-10-06 DIAGNOSIS — I10 Essential (primary) hypertension: Secondary | ICD-10-CM

## 2013-10-06 NOTE — Patient Instructions (Signed)
Continue losartan HCTZ and return in 6 months for six-month recheck with hemoglobin A1c and basic metabolic panel.

## 2013-10-07 LAB — BASIC METABOLIC PANEL
BUN: 19 mg/dL (ref 6–23)
CO2: 25 mEq/L (ref 19–32)
Calcium: 9.4 mg/dL (ref 8.4–10.5)
Chloride: 99 mEq/L (ref 96–112)
Creat: 0.91 mg/dL (ref 0.50–1.35)
Glucose, Bld: 121 mg/dL — ABNORMAL HIGH (ref 70–99)
Potassium: 4.3 mEq/L (ref 3.5–5.3)
Sodium: 134 mEq/L — ABNORMAL LOW (ref 135–145)

## 2013-10-07 NOTE — Progress Notes (Signed)
Patient informed. 

## 2013-10-25 ENCOUNTER — Other Ambulatory Visit: Payer: Self-pay | Admitting: Internal Medicine

## 2013-11-26 ENCOUNTER — Other Ambulatory Visit: Payer: Self-pay | Admitting: Internal Medicine

## 2014-01-16 ENCOUNTER — Encounter: Payer: Self-pay | Admitting: Internal Medicine

## 2014-01-16 NOTE — Progress Notes (Signed)
   Subjective:    Patient ID: Dominic Garner, male    DOB: [birth year], 48 y.o.   MRN: [id number]  HPI At last visit, Altace was discontinued and he was started on losartan 100 mg daily in addition to HCTZ. Here today for followup of hypertension. Basic metabolic panel drawn. History of impaired glucose tolerance and obesity. Blood pressure today is excellent. He feels well.    Review of Systems     Objective:   Physical Exam  Chest clear to auscultation. Cardiac exam regular rate and rhythm. Extremities without edema.      Assessment & Plan:  Hypertension well controlled on losartan HCTZ  Impaired glucose tolerance  Obesity  Plan: Continue diet exercise. Basic metabolic panel drawn.

## 2014-03-07 ENCOUNTER — Other Ambulatory Visit: Payer: BC Managed Care – PPO | Admitting: Internal Medicine

## 2014-03-07 DIAGNOSIS — I10 Essential (primary) hypertension: Secondary | ICD-10-CM

## 2014-03-07 DIAGNOSIS — R7309 Other abnormal glucose: Secondary | ICD-10-CM

## 2014-03-07 LAB — BASIC METABOLIC PANEL
BUN: 16 mg/dL (ref 6–23)
CO2: 28 mEq/L (ref 19–32)
Calcium: 9.6 mg/dL (ref 8.4–10.5)
Chloride: 100 mEq/L (ref 96–112)
Creat: 1 mg/dL (ref 0.50–1.35)
Glucose, Bld: 128 mg/dL — ABNORMAL HIGH (ref 70–99)
Potassium: 4.3 mEq/L (ref 3.5–5.3)
Sodium: 139 mEq/L (ref 135–145)

## 2014-03-07 LAB — HEMOGLOBIN A1C
Hgb A1c MFr Bld: 6.1 % — ABNORMAL HIGH (ref <5.7)
Mean Plasma Glucose: 128 mg/dL — ABNORMAL HIGH (ref <117)

## 2014-03-10 ENCOUNTER — Encounter: Payer: Self-pay | Admitting: Internal Medicine

## 2014-03-10 ENCOUNTER — Ambulatory Visit (INDEPENDENT_AMBULATORY_CARE_PROVIDER_SITE_OTHER): Payer: BC Managed Care – PPO | Admitting: Internal Medicine

## 2014-03-10 VITALS — BP 124/82 | HR 87 | Temp 96.6°F | Wt 246.0 lb

## 2014-03-10 DIAGNOSIS — R7302 Impaired glucose tolerance (oral): Secondary | ICD-10-CM

## 2014-03-10 DIAGNOSIS — I10 Essential (primary) hypertension: Secondary | ICD-10-CM

## 2014-03-10 MED ORDER — LOSARTAN POTASSIUM-HCTZ 100-25 MG PO TABS: 1.0000 | ORAL_TABLET | Freq: Every day | ORAL | Status: DC

## 2014-03-10 NOTE — Progress Notes (Signed)
   Subjective:    Patient ID: Dominic Garner, male    DOB: 25-Sep-1965, 48 y.o.   MRN: [id number]  HPI  For recheck on HTN and impaired glucose intolerance. Hgb AIC 6.1 which is a bit higher than last measurement.  BP is very good on Losartan 100 mg daily and HCTZ 25 mg daily. Says blood pressure doing well at home. No complaints or issues with medication.  Review of Systems     Objective:   Physical Exam  Chest clear to auscultation. Cardiac exam regular rate and rhythm. Extremities without edema.      Assessment & Plan:  Hypertension-stable  Impaired glucose tolerance-increase in hemoglobin A1c from 5.8 6.1%. Really needs to be serious about diet and exercise and weight loss.  Plan: Reassess in February with basic metabolic panel office visit blood pressure check and hemoglobin A1c. Change losartan and HCTZ to 1 tablet (Hyzaar) 100/25 daily.  Patient will get flu vaccine at work.

## 2014-03-10 NOTE — Patient Instructions (Addendum)
Return in Feb for [facility] and B-met. Watch diet and exercise.

## 2014-06-26 ENCOUNTER — Other Ambulatory Visit: Payer: BC Managed Care – PPO | Admitting: Internal Medicine

## 2014-06-29 ENCOUNTER — Ambulatory Visit: Payer: BC Managed Care – PPO | Admitting: Internal Medicine

## 2015-03-29 ENCOUNTER — Other Ambulatory Visit: Payer: Self-pay | Admitting: Internal Medicine

## 2015-04-13 ENCOUNTER — Other Ambulatory Visit: Payer: Self-pay

## 2015-04-13 MED ORDER — LOSARTAN POTASSIUM-HCTZ 100-25 MG PO TABS: 1.0000 | ORAL_TABLET | Freq: Every day | ORAL | Status: DC

## 2015-04-13 NOTE — Telephone Encounter (Signed)
Patient contacted office for refill on blood pressure medication and to schedule f/u appt. His appt is scheduled for Tues Dec. 20th. He is coming for labs on Thursday Dec. 15th. Please advise what labs need to be ordered (I am thinking- lipids, CMP, and an A1c). Also, he only has 2 of his losartan left- I have attached a 30-day no refill to get him to his appointment.

## 2015-04-20 ENCOUNTER — Telehealth: Payer: Self-pay | Admitting: Internal Medicine

## 2015-04-20 ENCOUNTER — Encounter: Payer: Self-pay | Admitting: Internal Medicine

## 2015-04-20 MED ORDER — CYCLOBENZAPRINE HCL 10 MG PO TABS: 10.0000 mg | ORAL_TABLET | Freq: Three times a day (TID) | ORAL | Status: DC | PRN

## 2015-04-20 MED ORDER — METHYLPREDNISOLONE 4 MG PO TABS: ORAL_TABLET | ORAL | Status: DC

## 2015-04-20 NOTE — Telephone Encounter (Signed)
Patient calls today stating he has strained a muscle in his lower back.  Has taken a pain pill.  Same thing happened back in 2012 when Dr. Lenord FellersBaxley treated him.  Wife cannot bring him to the office because she is tied up with a family at the funeral home and he doesn't know how long she may be.  Patient still has pain medication; wants to know if Dr. Lenord FellersBaxley will call him in the Sterapred and Flexeril as he was treated from 2012.    Pharmacy:  [facility] @ [address]  Patient [phone]  Spoke with Dr. Lenord FellersBaxley and she is going to call Rx's in and there will be a charge.  Patient will be seen in f/u and also to f/u on his BP on 12/20 @ 11:45, no labs to be drawn at this time.  We will then schedule patient for CPE and fasting labs after the new year.    Spoke with patient and informed him of this info.  Patient verbalized understanding and confirmed appointment date/time.

## 2015-04-20 NOTE — Telephone Encounter (Signed)
Says he strained his back. Says he cannot come in. Had hydrocodone on hand to take which he has taken. Wants prednisone and Flexeril called in which we will do. Has appointment next week for blood pressure check.

## 2015-04-26 ENCOUNTER — Other Ambulatory Visit: Payer: Self-pay | Admitting: Internal Medicine

## 2015-05-01 ENCOUNTER — Ambulatory Visit (INDEPENDENT_AMBULATORY_CARE_PROVIDER_SITE_OTHER): Payer: 59 | Admitting: Internal Medicine

## 2015-05-01 ENCOUNTER — Encounter: Payer: Self-pay | Admitting: Internal Medicine

## 2015-05-01 VITALS — BP 126/84 | HR 100 | Temp 98.2°F | Resp 20 | Ht 72.0 in | Wt 245.0 lb

## 2015-05-01 DIAGNOSIS — I10 Essential (primary) hypertension: Secondary | ICD-10-CM | POA: Diagnosis not present

## 2015-05-01 DIAGNOSIS — E669 Obesity, unspecified: Secondary | ICD-10-CM | POA: Diagnosis not present

## 2015-05-01 DIAGNOSIS — Z8739 Personal history of other diseases of the musculoskeletal system and connective tissue: Secondary | ICD-10-CM

## 2015-05-01 DIAGNOSIS — F988 Other specified behavioral and emotional disorders with onset usually occurring in childhood and adolescence: Secondary | ICD-10-CM

## 2015-05-01 DIAGNOSIS — F909 Attention-deficit hyperactivity disorder, unspecified type: Secondary | ICD-10-CM | POA: Diagnosis not present

## 2015-05-01 MED ORDER — LOSARTAN POTASSIUM-HCTZ 100-25 MG PO TABS: 1.0000 | ORAL_TABLET | Freq: Every day | ORAL | Status: DC

## 2015-05-01 NOTE — Progress Notes (Signed)
   Subjective:    Patient ID: Dominic Garner, male    DOB: 30-Jun-1965, 49 y.o.   MRN: [id number]  HPI 49 year old White Male in today with history of hypertension and attention deficit disorder. Last seen October 2015. We had to call and schedule an appointment for him for recheck and renewal of medications. He's asking about medication for attention deficit disorder. We had stopped it previously because of elevated blood pressure. [facility]. Blood pressure is excellent today on current regimen. He remains a bit overweight. Is trying to eat better.  Recently had some issues with low back pain we called in medication for that. He was out of work several days for back pain has resolved.  Declines flu vaccine today  Review of Systems     Objective:   Physical Exam Skin warm and dry. Nodes none. Neck is supple without JVD thyromegaly or carotid bruits. Chest clear to auscultation. Cardiac exam regular rate and rhythm normal S1 and S2. Extremities without edema.       Assessment & Plan:  Essential hypertension-stable on current regimen  Attention deficit disorder-refer to [facility]  History of low back pain-improved  Obesity-continue diet and exercise. Now has fit bit.  Plan: Schedule physical examination mid-March 2017. Refill losartan HCTZ  Through March 2017

## 2015-05-01 NOTE — Patient Instructions (Signed)
Continue same antihypertensive medication and return in March for physical exam. Continue to work on diet exercise. Flu vaccine declined.

## 2015-05-10 ENCOUNTER — Encounter: Payer: Self-pay | Admitting: Internal Medicine

## 2015-05-10 ENCOUNTER — Ambulatory Visit (INDEPENDENT_AMBULATORY_CARE_PROVIDER_SITE_OTHER): Payer: 59 | Admitting: Internal Medicine

## 2015-05-10 VITALS — BP 122/84 | HR 108 | Temp 98.6°F | Resp 20 | Ht 72.0 in | Wt 245.0 lb

## 2015-05-10 DIAGNOSIS — J029 Acute pharyngitis, unspecified: Secondary | ICD-10-CM

## 2015-05-10 DIAGNOSIS — L259 Unspecified contact dermatitis, unspecified cause: Secondary | ICD-10-CM

## 2015-05-10 DIAGNOSIS — I10 Essential (primary) hypertension: Secondary | ICD-10-CM

## 2015-05-10 LAB — POCT RAPID STREP A (OFFICE): Rapid Strep A Screen: NEGATIVE

## 2015-05-10 MED ORDER — TRIAMCINOLONE ACETONIDE 0.1 % EX CREA: 1.0000 "application " | TOPICAL_CREAM | Freq: Two times a day (BID) | CUTANEOUS | Status: DC

## 2015-05-10 MED ORDER — AZITHROMYCIN 250 MG PO TABS: ORAL_TABLET | ORAL | Status: DC

## 2015-05-10 NOTE — Patient Instructions (Addendum)
Use triamcinolone cream on neck twice daily. Takes Zithromax Z-PAK as directed. Tylenol as needed for fever and sore throat pain. Drink plenty of fluids fluids and rest.

## 2015-05-11 NOTE — Progress Notes (Signed)
   Subjective:    Patient ID: Dominic Garner, male    DOB: 09-Mar-1966, 49 y.o.   MRN: [id number]  HPI Patient called complaining of rash and wondering if he has shingles. Also has developed sore throat. Says he has a history of strep throat in the remote past. Rash feels irritated. It encompasses his entire neck. Not itchy. No new soaps or detergents.    Review of Systems as above     Objective:   Physical Exam  He has macular erythema about his neck in the collar area. No distinct papules or excoriations. Pharynx is red. Tonsils are red without exudate. Rapid strep screen is negative. TMs are clear. Neck supple. No adenopathy. Chest clear to auscultation.      Assessment & Plan:  Possible contact dermatitis versus viral rash  Acute pharyngitis  Plan: Zithromax Z-PAK take 2 tablets day one followed by 1 tablet days 2 through 5. Triamcinolone cream 0.1% 3 times a day to rash 5-7 days.

## 2015-07-24 ENCOUNTER — Other Ambulatory Visit: Payer: 59 | Admitting: Internal Medicine

## 2015-07-24 ENCOUNTER — Other Ambulatory Visit: Payer: Self-pay | Admitting: Internal Medicine

## 2015-07-24 DIAGNOSIS — I10 Essential (primary) hypertension: Secondary | ICD-10-CM

## 2015-07-24 DIAGNOSIS — E8881 Metabolic syndrome: Secondary | ICD-10-CM

## 2015-07-24 DIAGNOSIS — Z0000 Encounter for general adult medical examination without abnormal findings: Secondary | ICD-10-CM

## 2015-07-24 DIAGNOSIS — R7309 Other abnormal glucose: Secondary | ICD-10-CM

## 2015-07-24 LAB — CBC WITH DIFFERENTIAL/PLATELET
Basophils Absolute: 0.1 10*3/uL (ref 0.0–0.1)
Basophils Relative: 1 % (ref 0–1)
Eosinophils Absolute: 0.1 10*3/uL (ref 0.0–0.7)
Eosinophils Relative: 1 % (ref 0–5)
HCT: 46.4 % (ref 39.0–52.0)
Hemoglobin: 15.6 g/dL (ref 13.0–17.0)
Lymphocytes Relative: 42 % (ref 12–46)
Lymphs Abs: 2.6 10*3/uL (ref 0.7–4.0)
MCH: 29.9 pg (ref 26.0–34.0)
MCHC: 33.6 g/dL (ref 30.0–36.0)
MCV: 88.9 fL (ref 78.0–100.0)
MPV: 10.2 fL (ref 8.6–12.4)
Monocytes Absolute: 0.6 10*3/uL (ref 0.1–1.0)
Monocytes Relative: 10 % (ref 3–12)
Neutro Abs: 2.8 10*3/uL (ref 1.7–7.7)
Neutrophils Relative %: 46 % (ref 43–77)
Platelets: 291 10*3/uL (ref 150–400)
RBC: 5.22 MIL/uL (ref 4.22–5.81)
RDW: 13.8 % (ref 11.5–15.5)
WBC: 6.1 10*3/uL (ref 4.0–10.5)

## 2015-07-24 LAB — COMPREHENSIVE METABOLIC PANEL
ALT: 31 U/L (ref 9–46)
AST: 22 U/L (ref 10–35)
Albumin: 4.6 g/dL (ref 3.6–5.1)
Alkaline Phosphatase: 36 U/L — ABNORMAL LOW (ref 40–115)
BUN: 17 mg/dL (ref 7–25)
CO2: 27 mmol/L (ref 20–31)
Calcium: 9.6 mg/dL (ref 8.6–10.3)
Chloride: 102 mmol/L (ref 98–110)
Creat: 0.84 mg/dL (ref 0.70–1.33)
Glucose, Bld: 145 mg/dL — ABNORMAL HIGH (ref 65–99)
Potassium: 4.3 mmol/L (ref 3.5–5.3)
Sodium: 140 mmol/L (ref 135–146)
Total Bilirubin: 1.4 mg/dL — ABNORMAL HIGH (ref 0.2–1.2)
Total Protein: 7.6 g/dL (ref 6.1–8.1)

## 2015-07-24 LAB — LIPID PANEL
Cholesterol: 176 mg/dL (ref 125–200)
HDL: 48 mg/dL (ref >=40)
LDL Cholesterol: 108 mg/dL (ref <130)
Total CHOL/HDL Ratio: 3.7 Ratio (ref <=5.0)
Triglycerides: 100 mg/dL (ref <150)
VLDL: 20 mg/dL (ref <30)

## 2015-07-24 LAB — TSH: TSH: 1.61 mIU/L (ref 0.40–4.50)

## 2015-07-24 NOTE — Addendum Note (Signed)
Addended by: Doree BarthelLOWE, Lanaiya Lantry on: 07/24/2015 10:18 AM   Modules accepted: Orders

## 2015-07-25 LAB — PSA: PSA: 0.54 ng/mL (ref <=4.00)

## 2015-07-27 ENCOUNTER — Ambulatory Visit (INDEPENDENT_AMBULATORY_CARE_PROVIDER_SITE_OTHER): Payer: 59 | Admitting: Internal Medicine

## 2015-07-27 ENCOUNTER — Encounter: Payer: Self-pay | Admitting: Internal Medicine

## 2015-07-27 ENCOUNTER — Telehealth: Payer: Self-pay

## 2015-07-27 VITALS — BP 130/80 | HR 83 | Temp 97.0°F | Ht 72.0 in | Wt 247.0 lb

## 2015-07-27 DIAGNOSIS — E669 Obesity, unspecified: Secondary | ICD-10-CM | POA: Diagnosis not present

## 2015-07-27 DIAGNOSIS — I10 Essential (primary) hypertension: Secondary | ICD-10-CM | POA: Diagnosis not present

## 2015-07-27 DIAGNOSIS — R739 Hyperglycemia, unspecified: Secondary | ICD-10-CM | POA: Diagnosis not present

## 2015-07-27 DIAGNOSIS — Z0000 Encounter for general adult medical examination without abnormal findings: Secondary | ICD-10-CM

## 2015-07-27 LAB — POCT URINALYSIS DIPSTICK
Bilirubin, UA: NEGATIVE
Blood, UA: NEGATIVE
Glucose, UA: NEGATIVE
Ketones, UA: NEGATIVE
Leukocytes, UA: NEGATIVE
Nitrite, UA: NEGATIVE
Protein, UA: NEGATIVE
Spec Grav, UA: 1.02
Urobilinogen, UA: NEGATIVE
pH, UA: 6

## 2015-07-27 LAB — HEMOGLOBIN A1C
Hgb A1c MFr Bld: 6.1 % — ABNORMAL HIGH (ref <5.7)
Mean Plasma Glucose: 128 mg/dL — ABNORMAL HIGH (ref <117)

## 2015-07-27 NOTE — Telephone Encounter (Signed)
Spoke with [facility].  Added A1C to patients labs due to elevated glucose.

## 2015-07-27 NOTE — Patient Instructions (Signed)
Diet exercise and weight loss recommended. Patient does not want to be on medication for impaired glucose tolerance. Hemoglobin A1c added today. Return in 3 months for follow-up on hemoglobin A1c, weight loss and hypertension.

## 2015-07-27 NOTE — Progress Notes (Signed)
   Subjective:    Patient ID: Dominic Garner, male    DOB: [birth year], 50 y.o.   MRN: [id number]  HPI 50 year old White Male in today for health maintenance exam and evaluation of medical issues including obesity, impaired glucose tolerance, hypertension. Has not been able to lose weight over the past year. Blood pressure is stable at the present time. His fasting serum glucose is 145 which is higher than it was previously. Hemoglobin A1c added today and is pending. Remains overweight at 247 pounds. Has been on [place] real for some time for hypertension. Hemoglobin A1c has ranged from 5.8% to 6.1%. He has not wanted to be on medication for impaired glucose tolerance.  Past medical history: Appendectomy in the 1980s.  Penicillin causes a rash.  Does not smoke. Social alcohol consumption.  Social history: Married. No children. Employed by [employer] as a Designer, fashion/clothingfuneral director and embalmer. Wife works for [employer] and Engineer, maintenance ([employer] in Production assistant, radiocremation services.  Family history: Father with history of kidney stones and prostate cancer. Mother with history of tremor requiring electrode implantation in brain. She takes Sinemet but patient says she does not have Parkinson's disease. He will try to find out her exact diagnosis. Has been told it is familial. It started as a tremor and then worsened with some swallowing difficulties. Parents are in their 4970s. 2 sisters in good health.    Review of Systems  Constitutional: Negative.   Respiratory: Negative.   Cardiovascular: Negative.   Genitourinary: Negative.   Neurological: Negative.   Psychiatric/Behavioral: Negative.    has occasional low back pain otherwise negative     Objective:   Physical Exam  Constitutional: He is oriented to person, place, and time. He appears well-developed and well-nourished. No distress.  HENT:  Head: Normocephalic and atraumatic.  Right Ear: External ear normal.  Left Ear: External ear normal.  Mouth/Throat:  Oropharynx is clear and moist. No oropharyngeal exudate.  Eyes: Conjunctivae and EOM are normal. Pupils are equal, round, and reactive to light. Right eye exhibits no discharge. Left eye exhibits no discharge. No scleral icterus.  Neck: Neck supple. No JVD present. No thyromegaly present.  Cardiovascular: Normal rate, regular rhythm, normal heart sounds and intact distal pulses.   No murmur heard. Pulmonary/Chest: Effort normal and breath sounds normal. No respiratory distress. He has no wheezes. He has no rales.  Abdominal: Soft. Bowel sounds are normal. He exhibits no distension and no mass. There is no tenderness. There is no rebound and no guarding.  Genitourinary:  Prostate normal without nodules  Musculoskeletal: He exhibits no edema.  Lymphadenopathy:    He has no cervical adenopathy.  Neurological: He is alert and oriented to person, place, and time. He has normal reflexes. He displays normal reflexes. No cranial nerve deficit. Coordination normal.  Skin: Skin is warm and dry. No rash noted. He is not diaphoretic.  Psychiatric: He has a normal mood and affect. His behavior is normal. Judgment and thought content normal.  Vitals reviewed.         Assessment & Plan:  Obesity-needs to diet and exercise  Essential hypertension-stable  Impaired glucose tolerance-fasting glucose 145. Hemoglobin A1c pending. Does not want to be on metformin. Agreed to recheck in 3 months after trial of diet and exercise  History of low back pain-none recently  Plan: In 3 months he'll return for office visit and hemoglobin A1c along with blood pressure check.

## 2015-09-11 ENCOUNTER — Other Ambulatory Visit: Payer: Self-pay

## 2015-09-11 MED ORDER — LOSARTAN POTASSIUM-HCTZ 100-25 MG PO TABS: 1.0000 | ORAL_TABLET | Freq: Every day | ORAL | Status: DC

## 2015-11-06 ENCOUNTER — Other Ambulatory Visit: Payer: 59 | Admitting: Internal Medicine

## 2015-11-06 DIAGNOSIS — R739 Hyperglycemia, unspecified: Secondary | ICD-10-CM

## 2015-11-06 LAB — HEMOGLOBIN A1C
Hgb A1c MFr Bld: 6 % — ABNORMAL HIGH (ref <5.7)
Mean Plasma Glucose: 126 mg/dL

## 2015-11-09 ENCOUNTER — Encounter: Payer: Self-pay | Admitting: Internal Medicine

## 2015-11-09 ENCOUNTER — Ambulatory Visit (INDEPENDENT_AMBULATORY_CARE_PROVIDER_SITE_OTHER): Payer: 59 | Admitting: Internal Medicine

## 2015-11-09 VITALS — BP 116/72 | HR 68 | Temp 98.0°F | Resp 18 | Wt 238.0 lb

## 2015-11-09 DIAGNOSIS — R7302 Impaired glucose tolerance (oral): Secondary | ICD-10-CM

## 2015-11-09 DIAGNOSIS — I10 Essential (primary) hypertension: Secondary | ICD-10-CM | POA: Diagnosis not present

## 2015-11-09 DIAGNOSIS — E669 Obesity, unspecified: Secondary | ICD-10-CM

## 2015-11-09 NOTE — Patient Instructions (Signed)
Continue diet exercise and weight loss efforts. Return in 4 months for office visit blood pressure check and hemoglobin A1c

## 2015-11-09 NOTE — Progress Notes (Signed)
   Subjective:    Patient ID: Dominic Garner, male    DOB: Sep 25, 1965, 50 y.o.   MRN: [id number]  HPI 50 year old male in today for follow-up on hypertension, obesity, impaired glucose tolerance. He's lost 9 pounds since last visit which is very acceptable. He's been trying to lose some weight. His blood pressure is excellent on current regimen. His hemoglobin A1c is 6%.    Review of Systems     Objective:   Physical Exam  Neck supple without thyromegaly JVD or carotid bruits. Chest clear. Cardiac exam regular rate and rhythm normal S1 and S2. Extremities without edema      Assessment & Plan:  Impaired glucose tolerance  Essential hypertension  Obesity  Plan: Continue diet exercise and weight loss efforts. No change in medication regimen. Return in 4 months for office visit blood pressure check and hemoglobin A1c

## 2015-11-26 ENCOUNTER — Other Ambulatory Visit: Payer: Self-pay

## 2015-11-26 MED ORDER — LOSARTAN POTASSIUM-HCTZ 100-25 MG PO TABS: 1.0000 | ORAL_TABLET | Freq: Every day | ORAL | Status: DC

## 2016-03-03 ENCOUNTER — Other Ambulatory Visit: Payer: 59 | Admitting: Internal Medicine

## 2016-03-03 DIAGNOSIS — R7302 Impaired glucose tolerance (oral): Secondary | ICD-10-CM

## 2016-03-04 LAB — HEMOGLOBIN A1C
Hgb A1c MFr Bld: 6 % — ABNORMAL HIGH (ref <5.7)
Mean Plasma Glucose: 126 mg/dL

## 2016-03-06 ENCOUNTER — Ambulatory Visit (INDEPENDENT_AMBULATORY_CARE_PROVIDER_SITE_OTHER): Payer: 59 | Admitting: Internal Medicine

## 2016-03-06 ENCOUNTER — Encounter: Payer: Self-pay | Admitting: Internal Medicine

## 2016-03-06 VITALS — BP 120/84 | HR 80 | Wt 246.0 lb

## 2016-03-06 DIAGNOSIS — Z6832 Body mass index (BMI) 32.0-32.9, adult: Secondary | ICD-10-CM

## 2016-03-06 DIAGNOSIS — E6609 Other obesity due to excess calories: Secondary | ICD-10-CM

## 2016-03-06 DIAGNOSIS — I10 Essential (primary) hypertension: Secondary | ICD-10-CM

## 2016-03-06 DIAGNOSIS — R7302 Impaired glucose tolerance (oral): Secondary | ICD-10-CM

## 2016-03-06 NOTE — Patient Instructions (Addendum)
Continue same antihypertensive medications. Continue to work on diet exercise. Get flu vaccine at work. Return in April for physical examination.

## 2016-03-06 NOTE — Progress Notes (Signed)
   Subjective:    Patient ID: Dominic Garner, male    DOB: Jun 26, 1965, 50 y.o.   MRN: [id number]  HPI   50 year old Male for follow up HTN and impaired glucose tolerance. Has had issues with left central and lateral maxillary incisors  and needs implants.  Had colonoscopy by Dr. Loreta AveMann in his 5040's.Old records from paper chart indicating had an episode of acute diverticulitis on CT treated with antibiotics in October 2004 and he apparently had colonoscopy after that but we do not have copy of that report.  Says he's not been able to exercise due to pain from dental issues. He's gained 8 pounds since last visit. His blood pressure is stable.  He has a history of impaired glucose tolerance and hemoglobin A1c is stable at 6%  He declines flu vaccine and says he will get it at work  Blood pressure repeated today and was 120/84  Review of Systems as above     Objective:   Physical Exam Skin warm and dry. Nodes none. Neck is supple. Chest clear to auscultation. Cardiac exam regular rate and rhythm normal S1 and S2. Extremities without edema       Assessment & Plan:  Essential hypertension  Obesity  Dental issues  Impaired glucose tolerance  Plan: He should call Dr. Kenna GilbertMann's office and see when he needs to have colorectal screening for colon cancer. Continue same antihypertensive medications. Continue to watch diet. Return in April for physical examination.

## 2016-06-10 ENCOUNTER — Other Ambulatory Visit: Payer: Self-pay | Admitting: Internal Medicine

## 2016-08-05 DIAGNOSIS — L918 Other hypertrophic disorders of the skin: Secondary | ICD-10-CM | POA: Diagnosis not present

## 2016-08-11 ENCOUNTER — Other Ambulatory Visit: Payer: Self-pay | Admitting: Internal Medicine

## 2016-08-11 ENCOUNTER — Other Ambulatory Visit: Payer: BLUE CROSS/BLUE SHIELD | Admitting: Internal Medicine

## 2016-08-11 DIAGNOSIS — I10 Essential (primary) hypertension: Secondary | ICD-10-CM | POA: Diagnosis not present

## 2016-08-11 DIAGNOSIS — R7302 Impaired glucose tolerance (oral): Secondary | ICD-10-CM

## 2016-08-11 DIAGNOSIS — Z0000 Encounter for general adult medical examination without abnormal findings: Secondary | ICD-10-CM

## 2016-08-11 DIAGNOSIS — Z125 Encounter for screening for malignant neoplasm of prostate: Secondary | ICD-10-CM

## 2016-08-11 DIAGNOSIS — E669 Obesity, unspecified: Secondary | ICD-10-CM | POA: Diagnosis not present

## 2016-08-11 DIAGNOSIS — Z13220 Encounter for screening for lipoid disorders: Secondary | ICD-10-CM

## 2016-08-11 LAB — CBC WITH DIFFERENTIAL/PLATELET
Basophils Absolute: 52 cells/uL (ref 0–200)
Basophils Relative: 1 %
Eosinophils Absolute: 104 cells/uL (ref 15–500)
Eosinophils Relative: 2 %
HCT: 46.1 % (ref 38.5–50.0)
Hemoglobin: 15.5 g/dL (ref 13.2–17.1)
Lymphocytes Relative: 44 %
Lymphs Abs: 2288 cells/uL (ref 850–3900)
MCH: 30 pg (ref 27.0–33.0)
MCHC: 33.6 g/dL (ref 32.0–36.0)
MCV: 89.3 fL (ref 80.0–100.0)
MPV: 9.7 fL (ref 7.5–12.5)
Monocytes Absolute: 624 cells/uL (ref 200–950)
Monocytes Relative: 12 %
Neutro Abs: 2132 cells/uL (ref 1500–7800)
Neutrophils Relative %: 41 %
Platelets: 247 10*3/uL (ref 140–400)
RBC: 5.16 MIL/uL (ref 4.20–5.80)
RDW: 14.2 % (ref 11.0–15.0)
WBC: 5.2 10*3/uL (ref 3.8–10.8)

## 2016-08-12 LAB — COMPLETE METABOLIC PANEL WITH GFR
ALT: 21 U/L (ref 9–46)
AST: 17 U/L (ref 10–35)
Albumin: 4.6 g/dL (ref 3.6–5.1)
Alkaline Phosphatase: 37 U/L — ABNORMAL LOW (ref 40–115)
BUN: 15 mg/dL (ref 7–25)
CO2: 29 mmol/L (ref 20–31)
Calcium: 9.3 mg/dL (ref 8.6–10.3)
Chloride: 103 mmol/L (ref 98–110)
Creat: 0.88 mg/dL (ref 0.70–1.33)
GFR, Est African American: >89 mL/min (ref >=60)
GFR, Est Non African American: >89 mL/min (ref >=60)
Glucose, Bld: 149 mg/dL — ABNORMAL HIGH (ref 65–99)
Potassium: 4.5 mmol/L (ref 3.5–5.3)
Sodium: 142 mmol/L (ref 135–146)
Total Bilirubin: 0.9 mg/dL (ref 0.2–1.2)
Total Protein: 7.2 g/dL (ref 6.1–8.1)

## 2016-08-12 LAB — LIPID PANEL
Cholesterol: 182 mg/dL (ref <200)
HDL: 50 mg/dL (ref >40)
LDL Cholesterol: 115 mg/dL — ABNORMAL HIGH (ref <100)
Total CHOL/HDL Ratio: 3.6 Ratio (ref <5.0)
Triglycerides: 87 mg/dL (ref <150)
VLDL: 17 mg/dL (ref <30)

## 2016-08-12 LAB — HEMOGLOBIN A1C
Hgb A1c MFr Bld: 6 % — ABNORMAL HIGH (ref <5.7)
Mean Plasma Glucose: 126 mg/dL

## 2016-08-12 LAB — MICROALBUMIN, URINE: Microalb, Ur: 1.5 mg/dL

## 2016-08-12 LAB — PSA: PSA: 0.5 ng/mL (ref <=4.0)

## 2016-08-14 ENCOUNTER — Ambulatory Visit (INDEPENDENT_AMBULATORY_CARE_PROVIDER_SITE_OTHER): Payer: BLUE CROSS/BLUE SHIELD | Admitting: Internal Medicine

## 2016-08-14 ENCOUNTER — Encounter: Payer: Self-pay | Admitting: Internal Medicine

## 2016-08-14 VITALS — BP 108/80 | HR 89 | Temp 98.0°F | Ht 70.5 in | Wt 245.0 lb

## 2016-08-14 DIAGNOSIS — I10 Essential (primary) hypertension: Secondary | ICD-10-CM

## 2016-08-14 DIAGNOSIS — Z0000 Encounter for general adult medical examination without abnormal findings: Secondary | ICD-10-CM

## 2016-08-14 DIAGNOSIS — R7302 Impaired glucose tolerance (oral): Secondary | ICD-10-CM

## 2016-08-14 DIAGNOSIS — Z8739 Personal history of other diseases of the musculoskeletal system and connective tissue: Secondary | ICD-10-CM

## 2016-08-14 DIAGNOSIS — Z6833 Body mass index (BMI) 33.0-33.9, adult: Secondary | ICD-10-CM

## 2016-08-14 LAB — POCT URINALYSIS DIPSTICK
Bilirubin, UA: NEGATIVE
Blood, UA: NEGATIVE
Glucose, UA: NEGATIVE
Ketones, UA: NEGATIVE
Leukocytes, UA: NEGATIVE
Nitrite, UA: NEGATIVE
Protein, UA: NEGATIVE
Spec Grav, UA: 1.015 (ref 1.030–1.035)
Urobilinogen, UA: 0.2 (ref ?–2.0)
pH, UA: 7 (ref 5.0–8.0)

## 2016-08-14 NOTE — Patient Instructions (Addendum)
It was a pleaseure to see you. Please try to lose some weight. Recommend diet and regular exercise. Please contact Dr. Loreta Ave about repeat colonoscopy. Follow-up here in 6 months.

## 2016-09-03 NOTE — Progress Notes (Signed)
Subjective:    Patient ID: Dominic Garner, male    DOB: 1965/05/15, 51 y.o.   MRN: [id number]  HPI 51 year old White Male in today for health maintenance exam and evaluation of medical problems including obesity, hypertension and impaired glucose tolerance. He remains overweight. Weight today is 245 pounds and previously was 247 pounds in March 2017.  Hemoglobin A1c has ranged from 5.8% to 6.1%. He has not wanted to be on medication for impaired glucose tolerance such as metformin.  Past medical history: Appendectomy in the 1980s.  Penicillin causes a rash.  History of GE reflux treated with Prilosec 10 mg daily.  Currently on losartan HCTZ 100/25 daily.  Does not smoke. Social alcohol consumption.  Social history: Married. No children. 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 Wife works for [employer] and [employer].  Patient had a colonoscopy by Dr. Loreta Ave in his 52s. Old records from paper chart indicated he had an episode of acute diverticulitis detected on CT scant treated with antibiotics in October 2004 and he apparently had colonoscopy after that but we do not have a copy of that report.  Has had dental issues with left central and lateral maxillary incisors. He is in the process of getting implants.  Hemoglobin A1c is 6% and was previously 6%     6 months ago. LDL cholesterol is 1:15 and previously was 1081 year ago. PSA is normal. Father has history of prostate cancer. Fasting serum glucose is 149 and 1 year ago was 145. He really doesn't want to try metformin.  Family history: Father with history of kidney stones and prostate cancer. Mother with history of tremor requiring electrode implantation in brain. She takes Sinemet however, has been told by specialist that she does not have Parkinson's disease. Has been told that this is familial. It started as a tremor and then worsened with some swallowing difficulties. He has 2 sisters  in good health.         Review of Systems  Constitutional: Negative.   Respiratory: Negative.   Cardiovascular: Negative.   Gastrointestinal: Negative.   Musculoskeletal:       History of low back pain but not recently  Neurological: Negative.        Objective:   Physical Exam  Constitutional: He is oriented to person, place, and time. He appears well-developed and well-nourished. No distress.  HENT:  Head: Normocephalic and atraumatic.  Right Ear: External ear normal.  Left Ear: External ear normal.  Mouth/Throat: Oropharynx is clear and moist. No oropharyngeal exudate.  Eyes: Conjunctivae and EOM are normal. Pupils are equal, round, and reactive to light. Right eye exhibits no discharge. Left eye exhibits no discharge.  Neck: No JVD present. No thyromegaly present.  Cardiovascular: Normal rate, regular rhythm, normal heart sounds and intact distal pulses.   No murmur heard. Pulmonary/Chest: Effort normal and breath sounds normal. He has no wheezes. He has no rales.  Abdominal: Soft. Bowel sounds are normal. He exhibits no distension and no mass. There is no tenderness. There is no rebound and no guarding.  Genitourinary: Prostate normal.  Musculoskeletal: He exhibits no edema.  Lymphadenopathy:    He has no cervical adenopathy.  Neurological: He is alert and oriented to person, place, and time. He has normal reflexes. No cranial nerve deficit.  Skin: Skin is warm and dry. No rash noted. He is not diaphoretic.  Psychiatric: He has a normal mood and affect. His behavior  is normal. Judgment and thought content normal.  Vitals reviewed.         Assessment & Plan:  Essential hypertension-stable on current regimen  Impaired glucose tolerance-patient does not want to try metformin. Says he will continue to work on diet and exercise  Family history of prostate cancer in father-PSA is normal  Health maintenance-due for  screening colonoscopy. Patient will need to  contact Dr. Loreta Ave  Obesity-continue to work on diet and exercise. BMI 33  History of low back pain-no episodes recently  Plan: Return in 6 months for office visit, blood pressure check and hemoglobin A1c.

## 2016-09-03 NOTE — Progress Notes (Signed)
Labs drawn

## 2016-09-23 ENCOUNTER — Telehealth: Payer: Self-pay

## 2016-09-23 MED ORDER — LOSARTAN POTASSIUM-HCTZ 100-25 MG PO TABS: 1.0000 | ORAL_TABLET | Freq: Every day | ORAL | 1 refills | Status: DC

## 2016-09-23 NOTE — Telephone Encounter (Signed)
[facility]. Approved by Dr. Lenord FellersBaxley and sent in for 6 months

## 2016-11-20 ENCOUNTER — Encounter: Payer: Self-pay | Admitting: Internal Medicine

## 2016-11-20 ENCOUNTER — Telehealth: Payer: Self-pay | Admitting: Internal Medicine

## 2016-11-20 NOTE — Telephone Encounter (Signed)
He is  calling to ask when he last had Hepatitis B vaccine. We started seeing patient in 2001, and we do not have record of any hepatitis B vaccine/series at all. We do not have any hepatitis B serology either.  He had a needlestick recently and went to occupational health to have this evaluated. He says he was told he was negative for  Hepatitis B surface antibody. He likely needs to repeat the series if indeed this is true. I do not have a copy of that report.  Offered to provide series for him. 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 Says he cannot remember where he received a hepatitis B series but thinks it was in 2000 prior to seeing him.

## 2017-02-23 ENCOUNTER — Other Ambulatory Visit: Payer: BLUE CROSS/BLUE SHIELD | Admitting: Internal Medicine

## 2017-02-23 DIAGNOSIS — E785 Hyperlipidemia, unspecified: Secondary | ICD-10-CM | POA: Diagnosis not present

## 2017-02-23 DIAGNOSIS — I10 Essential (primary) hypertension: Secondary | ICD-10-CM

## 2017-02-23 DIAGNOSIS — R7302 Impaired glucose tolerance (oral): Secondary | ICD-10-CM | POA: Diagnosis not present

## 2017-02-24 LAB — HEPATIC FUNCTION PANEL
AG Ratio: 1.7 (calc) (ref 1.0–2.5)
ALT: 25 U/L (ref 9–46)
AST: 17 U/L (ref 10–35)
Albumin: 4.5 g/dL (ref 3.6–5.1)
Alkaline phosphatase (APISO): 40 U/L (ref 40–115)
Bilirubin, Direct: 0.1 mg/dL (ref 0.0–0.2)
Globulin: 2.6 g/dL (calc) (ref 1.9–3.7)
Indirect Bilirubin: 0.7 mg/dL (calc) (ref 0.2–1.2)
Total Bilirubin: 0.8 mg/dL (ref 0.2–1.2)
Total Protein: 7.1 g/dL (ref 6.1–8.1)

## 2017-02-24 LAB — HEMOGLOBIN A1C
Hgb A1c MFr Bld: 6.2 % of total Hgb — ABNORMAL HIGH (ref <5.7)
Mean Plasma Glucose: 131 (calc)
eAG (mmol/L): 7.3 (calc)

## 2017-02-24 LAB — LIPID PANEL
Cholesterol: 178 mg/dL (ref <200)
HDL: 44 mg/dL (ref >40)
LDL Cholesterol (Calc): 115 mg/dL (calc) — ABNORMAL HIGH
Non-HDL Cholesterol (Calc): 134 mg/dL (calc) — ABNORMAL HIGH (ref <130)
Total CHOL/HDL Ratio: 4 (calc) (ref <5.0)
Triglycerides: 90 mg/dL (ref <150)

## 2017-02-24 LAB — MICROALBUMIN / CREATININE URINE RATIO
Creatinine, Urine: 92 mg/dL (ref 20–320)
Microalb Creat Ratio: 2 mcg/mg creat (ref <30)
Microalb, Ur: 0.2 mg/dL

## 2017-03-03 ENCOUNTER — Ambulatory Visit (INDEPENDENT_AMBULATORY_CARE_PROVIDER_SITE_OTHER): Payer: BLUE CROSS/BLUE SHIELD | Admitting: Internal Medicine

## 2017-03-03 ENCOUNTER — Encounter: Payer: Self-pay | Admitting: Internal Medicine

## 2017-03-03 VITALS — BP 110/70 | HR 81 | Ht 70.5 in | Wt 250.0 lb

## 2017-03-03 DIAGNOSIS — E7800 Pure hypercholesterolemia, unspecified: Secondary | ICD-10-CM | POA: Diagnosis not present

## 2017-03-03 DIAGNOSIS — E8881 Metabolic syndrome: Secondary | ICD-10-CM

## 2017-03-03 DIAGNOSIS — Z6835 Body mass index (BMI) 35.0-35.9, adult: Secondary | ICD-10-CM

## 2017-03-03 DIAGNOSIS — I10 Essential (primary) hypertension: Secondary | ICD-10-CM | POA: Diagnosis not present

## 2017-03-03 DIAGNOSIS — R7302 Impaired glucose tolerance (oral): Secondary | ICD-10-CM

## 2017-03-03 NOTE — Patient Instructions (Signed)
Please work on diet and exercise.  Return in late January for follow-up.  Patient does not want to see dietitian at the present time.  Continue same medication for hypertension.

## 2017-03-03 NOTE — Progress Notes (Signed)
   Subjective:    Patient ID: Dominic Garner, male    DOB: 1966/03/14, 51 y.o.   MRN: [id number]  HPI 51 year old Male with mild hyperlipidemia and HTN. BP is under good control. Has not  been dieting.  BMI is 35.36 and weight is 250 pounds.  Blood pressures under good control on current regimen.  However hemoglobin A1c is increased from 6% to 6.2%.  He does not want to be on metformin.  He wants to try to diet. Fasting lipid panel shows LDL of 115, HDL 44, total cholesterol 782178 and triglycerides of 90.  Needs to get LDL under 100.  He is not on lipid-lowering medication.  Takes Prilosec for GE reflux.  Blood pressure treated with losartan HCTZ 100/25 daily  Needs colonoscopy and previously saw Dr. Loreta AveMann for colonoscopy before age 51.   Review of Systems see above Will get flu vaccine through employment    Objective:   Physical Exam Skin warm and dry.  Nodes none.  TMs and pharynx are clear.  Neck is supple without JVD thyromegaly or carotid bruits.  Chest clear to auscultation.  Cardiac exam regular rate and rhythm.  Extremities without edema.       Assessment & Plan:  Essential hypertension  Hyperlipidemia  Metabolic syndrome  Impaired glucose tolerance  BMI 35.36/weight 250 pounds  Plan: He does not want to go to dietitian at the present time.  He wants to work on this on his own.  He will return in late January or early February for follow-up.  Will need lipid panel and hemoglobin A1c along with office visit.

## 2017-03-11 ENCOUNTER — Other Ambulatory Visit: Payer: Self-pay | Admitting: Internal Medicine

## 2017-05-25 ENCOUNTER — Other Ambulatory Visit: Payer: Self-pay | Admitting: Internal Medicine

## 2017-05-25 DIAGNOSIS — E785 Hyperlipidemia, unspecified: Secondary | ICD-10-CM

## 2017-05-25 DIAGNOSIS — R7302 Impaired glucose tolerance (oral): Secondary | ICD-10-CM

## 2017-05-25 DIAGNOSIS — I10 Essential (primary) hypertension: Secondary | ICD-10-CM

## 2017-06-19 ENCOUNTER — Other Ambulatory Visit: Payer: BLUE CROSS/BLUE SHIELD | Admitting: Internal Medicine

## 2017-06-23 ENCOUNTER — Ambulatory Visit: Payer: BLUE CROSS/BLUE SHIELD | Admitting: Internal Medicine

## 2017-07-14 ENCOUNTER — Encounter: Payer: Self-pay | Admitting: Internal Medicine

## 2017-07-14 ENCOUNTER — Ambulatory Visit (INDEPENDENT_AMBULATORY_CARE_PROVIDER_SITE_OTHER): Payer: Self-pay | Admitting: Internal Medicine

## 2017-07-14 VITALS — BP 120/90 | HR 92 | Ht 70.5 in

## 2017-07-14 DIAGNOSIS — R7302 Impaired glucose tolerance (oral): Secondary | ICD-10-CM

## 2017-07-14 DIAGNOSIS — M545 Low back pain, unspecified: Secondary | ICD-10-CM

## 2017-07-14 DIAGNOSIS — I10 Essential (primary) hypertension: Secondary | ICD-10-CM

## 2017-07-14 MED ORDER — METHYLPREDNISOLONE 4 MG PO TABS: ORAL_TABLET | ORAL | 0 refills | Status: DC

## 2017-07-14 MED ORDER — CYCLOBENZAPRINE HCL 10 MG PO TABS: 10.0000 mg | ORAL_TABLET | Freq: Three times a day (TID) | ORAL | 0 refills | Status: DC | PRN

## 2017-07-14 NOTE — Progress Notes (Signed)
   Subjective:    Patient ID: Dominic Garner, male    DOB: 1965-09-22, 52 y.o.   MRN: [id number]  HPI Has another episode of low back pain.  Has not had one in some time.  Not sure what caused at this time.  No significant sciatica just mid low back pain.   Review of Systems see above-history of hypertension and impaired glucose tolerance     Objective:   Physical Exam Straight leg raising is negative at 90 degrees.  Muscle strength is normal in the lower extremities.  Deep tendon reflexes in the knees are 1+ and symmetrical.  Range of motion in the trunk is fairly good.       Assessment & Plan:  Acute low back pain  Plan: Medrol 4 mg to take and tapering course going from 24 mg to 0 mg over 6 days.  Flexeril refilled.

## 2017-07-28 ENCOUNTER — Other Ambulatory Visit: Payer: Self-pay | Admitting: Internal Medicine

## 2017-08-08 NOTE — Patient Instructions (Addendum)
Take Medrol and tapering course as directed.  Flexeril refilled.

## 2017-11-04 ENCOUNTER — Telehealth: Payer: Self-pay

## 2017-11-04 NOTE — Telephone Encounter (Signed)
Received a prescription refill request through fax and patient is overdue for a physical, left voicemail to call to schedule.

## 2017-11-05 ENCOUNTER — Other Ambulatory Visit: Payer: Self-pay

## 2017-11-05 MED ORDER — LOSARTAN POTASSIUM-HCTZ 100-25 MG PO TABS: 1.0000 | ORAL_TABLET | Freq: Every day | ORAL | 0 refills | Status: DC

## 2017-12-17 ENCOUNTER — Other Ambulatory Visit: Payer: Self-pay | Admitting: Internal Medicine

## 2017-12-17 DIAGNOSIS — I10 Essential (primary) hypertension: Secondary | ICD-10-CM

## 2017-12-17 DIAGNOSIS — E7800 Pure hypercholesterolemia, unspecified: Secondary | ICD-10-CM

## 2017-12-17 DIAGNOSIS — E8881 Metabolic syndrome: Secondary | ICD-10-CM

## 2017-12-17 DIAGNOSIS — R7302 Impaired glucose tolerance (oral): Secondary | ICD-10-CM

## 2017-12-17 DIAGNOSIS — Z0000 Encounter for general adult medical examination without abnormal findings: Secondary | ICD-10-CM

## 2017-12-22 ENCOUNTER — Other Ambulatory Visit: Payer: BLUE CROSS/BLUE SHIELD | Admitting: Internal Medicine

## 2017-12-22 DIAGNOSIS — I10 Essential (primary) hypertension: Secondary | ICD-10-CM

## 2017-12-22 DIAGNOSIS — E7800 Pure hypercholesterolemia, unspecified: Secondary | ICD-10-CM

## 2017-12-22 DIAGNOSIS — Z0000 Encounter for general adult medical examination without abnormal findings: Secondary | ICD-10-CM

## 2017-12-22 DIAGNOSIS — R7302 Impaired glucose tolerance (oral): Secondary | ICD-10-CM | POA: Diagnosis not present

## 2017-12-22 DIAGNOSIS — E8881 Metabolic syndrome: Secondary | ICD-10-CM

## 2017-12-23 LAB — CBC WITH DIFFERENTIAL/PLATELET
Basophils Absolute: 50 cells/uL (ref 0–200)
Basophils Relative: 0.9 %
Eosinophils Absolute: 711 cells/uL — ABNORMAL HIGH (ref 15–500)
Eosinophils Relative: 12.7 %
HCT: 44 % (ref 38.5–50.0)
Hemoglobin: 15.6 g/dL (ref 13.2–17.1)
Lymphs Abs: 2442 cells/uL (ref 850–3900)
MCH: 30.5 pg (ref 27.0–33.0)
MCHC: 35.5 g/dL (ref 32.0–36.0)
MCV: 86.1 fL (ref 80.0–100.0)
MPV: 10.3 fL (ref 7.5–12.5)
Monocytes Relative: 9.8 %
Neutro Abs: 1848 cells/uL (ref 1500–7800)
Neutrophils Relative %: 33 %
Platelets: 280 10*3/uL (ref 140–400)
RBC: 5.11 10*6/uL (ref 4.20–5.80)
RDW: 12.7 % (ref 11.0–15.0)
Total Lymphocyte: 43.6 %
WBC mixed population: 549 cells/uL (ref 200–950)
WBC: 5.6 10*3/uL (ref 3.8–10.8)

## 2017-12-23 LAB — COMPLETE METABOLIC PANEL WITH GFR
AG Ratio: 1.9 (calc) (ref 1.0–2.5)
ALT: 19 U/L (ref 9–46)
AST: 16 U/L (ref 10–35)
Albumin: 4.6 g/dL (ref 3.6–5.1)
Alkaline phosphatase (APISO): 37 U/L — ABNORMAL LOW (ref 40–115)
BUN: 18 mg/dL (ref 7–25)
CO2: 28 mmol/L (ref 20–32)
Calcium: 9.3 mg/dL (ref 8.6–10.3)
Chloride: 101 mmol/L (ref 98–110)
Creat: 0.97 mg/dL (ref 0.70–1.33)
GFR, Est African American: 103 mL/min/{1.73_m2} (ref >=60)
GFR, Est Non African American: 89 mL/min/{1.73_m2} (ref >=60)
Globulin: 2.4 g/dL (calc) (ref 1.9–3.7)
Glucose, Bld: 159 mg/dL — ABNORMAL HIGH (ref 65–99)
Potassium: 4.4 mmol/L (ref 3.5–5.3)
Sodium: 138 mmol/L (ref 135–146)
Total Bilirubin: 0.9 mg/dL (ref 0.2–1.2)
Total Protein: 7 g/dL (ref 6.1–8.1)

## 2017-12-23 LAB — LIPID PANEL
Cholesterol: 175 mg/dL (ref <200)
HDL: 50 mg/dL (ref >40)
LDL Cholesterol (Calc): 109 mg/dL (calc) — ABNORMAL HIGH
Non-HDL Cholesterol (Calc): 125 mg/dL (calc) (ref <130)
Total CHOL/HDL Ratio: 3.5 (calc) (ref <5.0)
Triglycerides: 70 mg/dL (ref <150)

## 2017-12-23 LAB — MICROALBUMIN / CREATININE URINE RATIO
Creatinine, Urine: 173 mg/dL (ref 20–320)
Microalb Creat Ratio: 3 mcg/mg creat (ref <30)
Microalb, Ur: 0.5 mg/dL

## 2017-12-23 LAB — HEMOGLOBIN A1C
Hgb A1c MFr Bld: 6.5 % of total Hgb — ABNORMAL HIGH (ref <5.7)
Mean Plasma Glucose: 140 (calc)
eAG (mmol/L): 7.7 (calc)

## 2017-12-23 LAB — PSA: PSA: 0.6 ng/mL (ref <=4.0)

## 2017-12-28 ENCOUNTER — Encounter: Payer: Self-pay | Admitting: Internal Medicine

## 2017-12-28 ENCOUNTER — Ambulatory Visit (INDEPENDENT_AMBULATORY_CARE_PROVIDER_SITE_OTHER): Payer: BLUE CROSS/BLUE SHIELD | Admitting: Internal Medicine

## 2017-12-28 VITALS — BP 102/80 | HR 94 | Ht 70.5 in | Wt 246.0 lb

## 2017-12-28 DIAGNOSIS — M545 Low back pain: Secondary | ICD-10-CM

## 2017-12-28 DIAGNOSIS — Z6834 Body mass index (BMI) 34.0-34.9, adult: Secondary | ICD-10-CM

## 2017-12-28 DIAGNOSIS — Z0000 Encounter for general adult medical examination without abnormal findings: Secondary | ICD-10-CM | POA: Diagnosis not present

## 2017-12-28 DIAGNOSIS — I10 Essential (primary) hypertension: Secondary | ICD-10-CM | POA: Diagnosis not present

## 2017-12-28 DIAGNOSIS — E7800 Pure hypercholesterolemia, unspecified: Secondary | ICD-10-CM

## 2017-12-28 DIAGNOSIS — R7302 Impaired glucose tolerance (oral): Secondary | ICD-10-CM

## 2017-12-28 DIAGNOSIS — E8881 Metabolic syndrome: Secondary | ICD-10-CM

## 2017-12-28 LAB — POCT URINALYSIS DIPSTICK
Appearance: NEGATIVE
Bilirubin, UA: NEGATIVE
Blood, UA: NEGATIVE
Glucose, UA: NEGATIVE
Ketones, UA: NEGATIVE
Leukocytes, UA: NEGATIVE
Nitrite, UA: NEGATIVE
Odor: NEGATIVE
Protein, UA: NEGATIVE
Spec Grav, UA: 1.015 (ref 1.010–1.025)
Urobilinogen, UA: 0.2 E.U./dL
pH, UA: 6.5 (ref 5.0–8.0)

## 2018-01-06 NOTE — Progress Notes (Signed)
Subjective:    Patient ID: Dominic Garner, male    DOB: 1966-03-05, 52 y.o.   MRN: [id number]  HPI 52 year old male in today for health maintenance exam and evaluation of medical issues including obesity, hypertension, impaired glucose tolerance.  He remains overweight.  Last year he weighed 245 pounds in this year 246 pounds.  He monitors his blood pressure at home.  Hemoglobin A1c has ranged from 5.8% to 6.1%.  He refuses to be on metformin.  This is been mentioned to him several times previously.  However now, hemoglobin A1c is 6.5%.  Last year hemoglobin A1c was 6.2% he wants another trial of diet and exercise.  Fasting glucose is 159.  LDL is elevated at 109 and last year was 115.  There is a family history of prostate cancer in his father but his PSA is normal.  History of GE reflux treated with Prilosec  Penicillin causes a rash  Past medical history: Appendectomy in the 1980s.  For hypertension he is on losartan HCTZ 100/25 daily  He does not smoke.  Social alcohol consumption.  Social history: He is married.  No children.  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  His wife works for for [employer].  History of dental implants involving left central and lateral maxillary incisors.  Had colonoscopy by Dr. Loreta Ave in his 78s.  Old records from paper chart indicate he had an episode of acute diverticulitis detected on CT scan treated with antibiotics October 2004 and apparently had colonoscopy after that but we do not have a copy of that report.  Family history: Father with history of kidney stones and prostate cancer.  Mother with history of tremor requiring electrode implantation in the brain and was treated with Sinemet but was told she did not have Parkinson's disease.  She died last year of complications of septicemia.  Patient thinks that she aspirated and developed pneumonia.  He has 2 sisters in good  health.    Review of Systems negative except for intermittent recurrent low back pain     Objective:   Physical Exam  Constitutional: He is oriented to person, place, and time. He appears well-developed and well-nourished. No distress.  HENT:  Head: Normocephalic and atraumatic.  Right Ear: External ear normal.  Left Ear: External ear normal.  Mouth/Throat: Oropharynx is clear and moist. No oropharyngeal exudate.  Eyes: Pupils are equal, round, and reactive to light. Conjunctivae and EOM are normal. Right eye exhibits no discharge. Left eye exhibits no discharge.  Neck: Neck supple. No JVD present. No thyromegaly present.  Cardiovascular: Normal rate, regular rhythm and normal heart sounds.  No murmur heard. Pulmonary/Chest: Effort normal and breath sounds normal. No stridor. No respiratory distress. He has no wheezes. He has no rales.  Abdominal: Bowel sounds are normal. He exhibits no distension and no mass. There is no tenderness. There is no rebound and no guarding.  Genitourinary: Prostate normal.  Musculoskeletal: He exhibits no edema.  Neurological: He is alert and oriented to person, place, and time. He displays normal reflexes. No cranial nerve deficit. He exhibits normal muscle tone. Coordination normal.  Skin: Skin is warm and dry. He is not diaphoretic.  Psychiatric: He has a normal mood and affect. His behavior is normal. Judgment and thought content normal.  Vitals reviewed.         Assessment & Plan:  Essential hypertension-stable on current regimen with no changes today  BMI 34.80.  Needs to diet exercise and lose weight  Impaired glucose tolerance-does not want to be on metformin.  Agrees to return in 6 months for follow-up.  GE reflux treated with PPI over-the-counter  Family history prostate cancer in father  History of recurrent low back pain intermittently  Patient is to contact Dr. Loreta AveMann regarding screening colonoscopy.  We talked about this last  year.  Plan: Return in 6 months for reevaluation of these issues.

## 2018-01-06 NOTE — Patient Instructions (Signed)
Continue same medications.  Please work on diet exercise and weight loss.  Return in 6 months for follow-up of these issues discussed today.  Please call Dr. Loreta AveMann regarding repeat screening colonoscopy.

## 2018-01-07 ENCOUNTER — Telehealth: Payer: Self-pay

## 2018-01-07 NOTE — Telephone Encounter (Signed)
Left message reminding patient to contact Dr. Christene LyeJiothy Mann to schedule a consultation for a colonoscopy. Left Dr. Kenna GilbertMann's phone number.

## 2018-01-27 ENCOUNTER — Other Ambulatory Visit: Payer: Self-pay | Admitting: Internal Medicine

## 2018-04-27 ENCOUNTER — Other Ambulatory Visit: Payer: BLUE CROSS/BLUE SHIELD | Admitting: Internal Medicine

## 2018-04-30 ENCOUNTER — Ambulatory Visit: Payer: BLUE CROSS/BLUE SHIELD | Admitting: Internal Medicine

## 2018-05-02 ENCOUNTER — Other Ambulatory Visit: Payer: Self-pay | Admitting: Internal Medicine

## 2018-05-24 ENCOUNTER — Other Ambulatory Visit: Payer: BLUE CROSS/BLUE SHIELD | Admitting: Internal Medicine

## 2018-05-27 ENCOUNTER — Ambulatory Visit: Payer: BLUE CROSS/BLUE SHIELD | Admitting: Internal Medicine

## 2018-06-08 ENCOUNTER — Other Ambulatory Visit: Payer: BLUE CROSS/BLUE SHIELD | Admitting: Internal Medicine

## 2018-06-08 DIAGNOSIS — R7302 Impaired glucose tolerance (oral): Secondary | ICD-10-CM | POA: Diagnosis not present

## 2018-06-08 LAB — HEMOGLOBIN A1C
Hgb A1c MFr Bld: 6.6 % of total Hgb — ABNORMAL HIGH (ref <5.7)
Mean Plasma Glucose: 143 (calc)
eAG (mmol/L): 7.9 (calc)

## 2018-06-11 ENCOUNTER — Encounter: Payer: Self-pay | Admitting: Internal Medicine

## 2018-06-11 ENCOUNTER — Ambulatory Visit (INDEPENDENT_AMBULATORY_CARE_PROVIDER_SITE_OTHER): Payer: BLUE CROSS/BLUE SHIELD | Admitting: Internal Medicine

## 2018-06-11 VITALS — BP 110/80 | HR 92 | Ht 70.5 in | Wt 249.0 lb

## 2018-06-11 DIAGNOSIS — R7302 Impaired glucose tolerance (oral): Secondary | ICD-10-CM

## 2018-06-11 DIAGNOSIS — I10 Essential (primary) hypertension: Secondary | ICD-10-CM | POA: Diagnosis not present

## 2018-06-11 DIAGNOSIS — E8881 Metabolic syndrome: Secondary | ICD-10-CM

## 2018-06-11 MED ORDER — METFORMIN HCL 500 MG PO TABS: 500.0000 mg | ORAL_TABLET | Freq: Every day | ORAL | 0 refills | Status: DC

## 2018-06-11 NOTE — Patient Instructions (Signed)
Watch diet and try to get some exercise.  Continue same antihypertensive medications.  Begin metformin 500 mg daily and follow-up in 6 weeks with hemoglobin A1c

## 2018-06-11 NOTE — Progress Notes (Signed)
   Subjective:    Patient ID: Dominic Garner, male    DOB: 09-04-1965, 53 y.o.   MRN: [id number]  HPI 53 year old Male in today to follow-up on hypertension and impaired glucose tolerance. Weight last visit 246 and now 249 pounds.  Ate a lot of sweets during the Christmas holidays.  Hemoglobin A1c has increased to 6.6% previously was 6.5% in August and 6.2% a year ago.   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  Chest clear to auscultation.  Skin warm and dry.  Nodes none.  No lower extremity edema.  Cardiac exam regular rate and rhythm.       Assessment & Plan:  Impaired glucose tolerance with hemoglobin A1c increasing  Not motivated to diet and exercise at the present time.  Does not want to see dietitian.  Essential hypertension-stable on current regimen  Plan: He will start metformin 500 mg daily and follow-up with hemoglobin A1c without office visit in 6 weeks.

## 2018-07-23 ENCOUNTER — Other Ambulatory Visit: Payer: Self-pay

## 2018-07-23 ENCOUNTER — Other Ambulatory Visit: Payer: BLUE CROSS/BLUE SHIELD | Admitting: Internal Medicine

## 2018-07-23 DIAGNOSIS — R7302 Impaired glucose tolerance (oral): Secondary | ICD-10-CM | POA: Diagnosis not present

## 2018-07-24 LAB — HEMOGLOBIN A1C
Hgb A1c MFr Bld: 6.7 % of total Hgb — ABNORMAL HIGH (ref <5.7)
Mean Plasma Glucose: 146 (calc)
eAG (mmol/L): 8.1 (calc)

## 2018-07-26 ENCOUNTER — Other Ambulatory Visit: Payer: Self-pay | Admitting: Internal Medicine

## 2018-07-26 MED ORDER — METFORMIN HCL 500 MG PO TABS: 500.0000 mg | ORAL_TABLET | Freq: Two times a day (BID) | ORAL | 0 refills | Status: DC

## 2018-09-02 ENCOUNTER — Other Ambulatory Visit: Payer: Self-pay | Admitting: Internal Medicine

## 2018-09-10 ENCOUNTER — Telehealth: Payer: Self-pay | Admitting: Internal Medicine

## 2018-09-10 NOTE — Telephone Encounter (Signed)
[facility] [phone]  [facility] [address]  losartan-hydrochlorothiazide [facility]) 100-25 MG tablet   Elliot Gurney called to say he needs refill on above medication.

## 2018-09-13 MED ORDER — LOSARTAN POTASSIUM-HCTZ 100-25 MG PO TABS: 1.0000 | ORAL_TABLET | Freq: Every day | ORAL | 0 refills | Status: DC

## 2018-09-16 ENCOUNTER — Other Ambulatory Visit: Payer: Self-pay

## 2018-09-16 ENCOUNTER — Other Ambulatory Visit: Payer: BLUE CROSS/BLUE SHIELD | Admitting: Internal Medicine

## 2018-09-16 DIAGNOSIS — R7302 Impaired glucose tolerance (oral): Secondary | ICD-10-CM

## 2018-09-17 LAB — HEMOGLOBIN A1C
Hgb A1c MFr Bld: 6.1 % of total Hgb — ABNORMAL HIGH (ref <5.7)
Mean Plasma Glucose: 128 (calc)
eAG (mmol/L): 7.1 (calc)

## 2018-12-06 ENCOUNTER — Other Ambulatory Visit: Payer: Self-pay | Admitting: Internal Medicine

## 2018-12-27 ENCOUNTER — Other Ambulatory Visit: Payer: BC Managed Care – PPO | Admitting: Internal Medicine

## 2018-12-27 ENCOUNTER — Other Ambulatory Visit: Payer: Self-pay

## 2018-12-27 DIAGNOSIS — Z125 Encounter for screening for malignant neoplasm of prostate: Secondary | ICD-10-CM | POA: Diagnosis not present

## 2018-12-27 DIAGNOSIS — E7800 Pure hypercholesterolemia, unspecified: Secondary | ICD-10-CM | POA: Diagnosis not present

## 2018-12-27 DIAGNOSIS — I10 Essential (primary) hypertension: Secondary | ICD-10-CM | POA: Diagnosis not present

## 2018-12-27 DIAGNOSIS — E8881 Metabolic syndrome: Secondary | ICD-10-CM

## 2018-12-27 DIAGNOSIS — Z0000 Encounter for general adult medical examination without abnormal findings: Secondary | ICD-10-CM | POA: Diagnosis not present

## 2018-12-28 LAB — HEMOGLOBIN A1C
Hgb A1c MFr Bld: 6 % of total Hgb — ABNORMAL HIGH (ref <5.7)
Mean Plasma Glucose: 126 (calc)
eAG (mmol/L): 7 (calc)

## 2018-12-28 LAB — CBC WITH DIFFERENTIAL/PLATELET
Absolute Monocytes: 549 cells/uL (ref 200–950)
Basophils Absolute: 71 cells/uL (ref 0–200)
Basophils Relative: 1.2 %
Eosinophils Absolute: 89 cells/uL (ref 15–500)
Eosinophils Relative: 1.5 %
HCT: 44.7 % (ref 38.5–50.0)
Hemoglobin: 15.3 g/dL (ref 13.2–17.1)
Lymphs Abs: 2071 cells/uL (ref 850–3900)
MCH: 30.2 pg (ref 27.0–33.0)
MCHC: 34.2 g/dL (ref 32.0–36.0)
MCV: 88.3 fL (ref 80.0–100.0)
MPV: 10.4 fL (ref 7.5–12.5)
Monocytes Relative: 9.3 %
Neutro Abs: 3121 cells/uL (ref 1500–7800)
Neutrophils Relative %: 52.9 %
Platelets: 293 10*3/uL (ref 140–400)
RBC: 5.06 10*6/uL (ref 4.20–5.80)
RDW: 13.1 % (ref 11.0–15.0)
Total Lymphocyte: 35.1 %
WBC: 5.9 10*3/uL (ref 3.8–10.8)

## 2018-12-28 LAB — COMPLETE METABOLIC PANEL WITH GFR
AG Ratio: 1.7 (calc) (ref 1.0–2.5)
ALT: 17 U/L (ref 9–46)
AST: 19 U/L (ref 10–35)
Albumin: 4.6 g/dL (ref 3.6–5.1)
Alkaline phosphatase (APISO): 34 U/L — ABNORMAL LOW (ref 35–144)
BUN: 17 mg/dL (ref 7–25)
CO2: 26 mmol/L (ref 20–32)
Calcium: 9.6 mg/dL (ref 8.6–10.3)
Chloride: 103 mmol/L (ref 98–110)
Creat: 0.81 mg/dL (ref 0.70–1.33)
GFR, Est African American: 117 mL/min/{1.73_m2} (ref >=60)
GFR, Est Non African American: 101 mL/min/{1.73_m2} (ref >=60)
Globulin: 2.7 g/dL (calc) (ref 1.9–3.7)
Glucose, Bld: 136 mg/dL — ABNORMAL HIGH (ref 65–99)
Potassium: 4.1 mmol/L (ref 3.5–5.3)
Sodium: 139 mmol/L (ref 135–146)
Total Bilirubin: 1.2 mg/dL (ref 0.2–1.2)
Total Protein: 7.3 g/dL (ref 6.1–8.1)

## 2018-12-28 LAB — LIPID PANEL
Cholesterol: 200 mg/dL — ABNORMAL HIGH (ref <200)
HDL: 51 mg/dL (ref >=40)
LDL Cholesterol (Calc): 131 mg/dL (calc) — ABNORMAL HIGH
Non-HDL Cholesterol (Calc): 149 mg/dL (calc) — ABNORMAL HIGH (ref <130)
Total CHOL/HDL Ratio: 3.9 (calc) (ref <5.0)
Triglycerides: 82 mg/dL (ref <150)

## 2018-12-28 LAB — PSA: PSA: 0.5 ng/mL (ref <=4.0)

## 2018-12-31 ENCOUNTER — Other Ambulatory Visit: Payer: Self-pay

## 2018-12-31 ENCOUNTER — Ambulatory Visit (INDEPENDENT_AMBULATORY_CARE_PROVIDER_SITE_OTHER): Payer: BC Managed Care – PPO | Admitting: Internal Medicine

## 2018-12-31 ENCOUNTER — Encounter: Payer: Self-pay | Admitting: Internal Medicine

## 2018-12-31 VITALS — BP 110/80 | HR 91 | Temp 98.3°F | Ht 70.5 in | Wt 238.0 lb

## 2018-12-31 DIAGNOSIS — E7800 Pure hypercholesterolemia, unspecified: Secondary | ICD-10-CM

## 2018-12-31 DIAGNOSIS — Z6833 Body mass index (BMI) 33.0-33.9, adult: Secondary | ICD-10-CM

## 2018-12-31 DIAGNOSIS — I10 Essential (primary) hypertension: Secondary | ICD-10-CM

## 2018-12-31 DIAGNOSIS — R7302 Impaired glucose tolerance (oral): Secondary | ICD-10-CM | POA: Diagnosis not present

## 2018-12-31 DIAGNOSIS — Z0000 Encounter for general adult medical examination without abnormal findings: Secondary | ICD-10-CM

## 2018-12-31 DIAGNOSIS — E8881 Metabolic syndrome: Secondary | ICD-10-CM

## 2018-12-31 LAB — POCT URINALYSIS DIPSTICK
Appearance: NEGATIVE
Bilirubin, UA: NEGATIVE
Blood, UA: NEGATIVE
Glucose, UA: NEGATIVE
Ketones, UA: NEGATIVE
Leukocytes, UA: NEGATIVE
Nitrite, UA: NEGATIVE
Odor: NEGATIVE
Protein, UA: NEGATIVE
Spec Grav, UA: 1.01 (ref 1.010–1.025)
Urobilinogen, UA: 0.2 E.U./dL
pH, UA: 6.5 (ref 5.0–8.0)

## 2018-12-31 MED ORDER — METFORMIN HCL 500 MG PO TABS: 500.0000 mg | ORAL_TABLET | Freq: Two times a day (BID) | ORAL | 3 refills | Status: DC

## 2018-12-31 MED ORDER — LOSARTAN POTASSIUM-HCTZ 100-25 MG PO TABS: 1.0000 | ORAL_TABLET | Freq: Every day | ORAL | 1 refills | Status: DC

## 2018-12-31 NOTE — Patient Instructions (Addendum)
It was a pleasure to see you today. Watch diet, and try to get regular exercise.  Have flu vaccine through employment.  Continue current medications.  Return in 6 months.

## 2018-12-31 NOTE — Progress Notes (Signed)
   Subjective:    Patient ID: Dominic Garner., male    DOB: [birth year], 53 y.o.   MRN: [id number]  HPI 53 year old Male for health maintenance exam and evaluation of medical issues including hypertension, impaired glucose tolerance and obesity.  BMI is 33.67.  Weight 238 pounds.  He has lost 8 pounds in the past year.  Has not been doing much exercising during the pandemic.  History of GE reflux treated with over-the-counter Prilosec.  Penicillin causes a rash.  Past medical history: Appendectomy 1980's.  Social history: He is married.  No children.  Does not smoke.  Social alcohol consumption.  He is employed by [employer] as a Engineer, materials. Wife works as Nurse, mental health.  Family history: Father with history of kidney stones and prostate cancer.  Mother with history of tremor requiring electrode implantation in the brain and was treated with Sinemet but was told she did not have Parkinson's disease.  She died last year of complications of septicemia.  2 sisters in good health.    Review of Systems has intermittent recurrent low back pain treated with Medrol and Flexeril.  Last episode March 2019.     Objective:   Physical Exam Blood pressure 110/80, pulse 91, temperature 98.3 degrees pulse oximetry 94% weight 238 pounds BMI 33.67 skin warm and dry.  Nodes none.  Neck is supple without JVD thyromegaly or carotid bruits.  Chest is clear to auscultation without rales or wheezing.  Cardiac exam: Regular rate and rhythm normal S1 and S2 without murmurs rubs or gallops.  Abdomen obese soft nondistended without hepatosplenomegaly masses or tenderness.  Prostate is normal without nodules.  Extremities without edema or deformity.  Neuro no focal deficits.  No tremor noted.       Assessment & Plan:  Essential hypertension-stable on losartan HCTZ  History of recurrent low back pain but no recent episodes treated conservatively  with medication alone episodic basis  BMI 33.67-discussion regarding diet exercise and weight loss  Impaired glucose tolerance-hemoglobin A1c is 6% on metformin.  Hyperlipidemia-total cholesterol is 200 and LDL cholesterol is 131.  Last year LDL was 109 and total cholesterol was 175.  Triglycerides are normal.  HDL is 51.  Encourage diet exercise and weight loss.  Have not put him on statin medication yet.  He would prefer to work on diet and exercise.  Plan: Return in 6 months at which time he will have hemoglobin A1c and fasting lipid panel as well as office visit and blood pressure check.  No change in medications today.  He will receive flu vaccine through his employment.

## 2019-03-05 DIAGNOSIS — Z20828 Contact with and (suspected) exposure to other viral communicable diseases: Secondary | ICD-10-CM | POA: Diagnosis not present

## 2019-03-24 ENCOUNTER — Telehealth (INDEPENDENT_AMBULATORY_CARE_PROVIDER_SITE_OTHER): Payer: BC Managed Care – PPO | Admitting: Internal Medicine

## 2019-03-24 DIAGNOSIS — Z20828 Contact with and (suspected) exposure to other viral communicable diseases: Secondary | ICD-10-CM

## 2019-03-24 NOTE — Telephone Encounter (Signed)
Agree he needs to be tested and quarantine until result is back.

## 2019-03-24 NOTE — Telephone Encounter (Signed)
Called Patient and he verbalized understanding

## 2019-03-24 NOTE — Telephone Encounter (Signed)
Dominic Garner [phone]   W[place]dy called for 2 things 1st his wife has tested positive for COVID, she went yesterday and results came back just a few minutes ago. His work said it was okay for him to come in to work unless he himself tested positive and he could come in until he got his results. I let him know that he should also be quarantining with his wife for 14 days and if he needs a note to let us know. He was also exposed by a coworker by being in the same car with someone that has now tested positive.He may go to the [place] tomorrow and get tested.    2 nd prescription refill - I did advise to call pharmacy.  [id number] [place] [place] [facility] [address] [phone] (Phone) [phone] (Fax)   losartan-hydrochlorothiazide (HYZAAR) 100-25 MG tablet  [name] called to say he needs a refill on above medication.

## 2019-03-25 ENCOUNTER — Encounter: Payer: Self-pay | Admitting: Internal Medicine

## 2019-03-25 ENCOUNTER — Other Ambulatory Visit: Payer: Self-pay

## 2019-03-25 DIAGNOSIS — Z20822 Contact with and (suspected) exposure to covid-19: Secondary | ICD-10-CM

## 2019-03-25 NOTE — Telephone Encounter (Signed)
Wife has tested positive for Covid-19 and has mild symptoms. Pt has low grade fever. Note faxed to him to be out of work until test results are back then will advise further. He is getting tested today.I spoke with him personally by phone today. He says he is needed at work but needs to quarantine for now.

## 2019-03-28 ENCOUNTER — Encounter: Payer: Self-pay | Admitting: Internal Medicine

## 2019-03-28 ENCOUNTER — Telehealth: Payer: Self-pay | Admitting: Internal Medicine

## 2019-03-28 LAB — NOVEL CORONAVIRUS, NAA: SARS-CoV-2, NAA: NOT DETECTED

## 2019-03-28 NOTE — Telephone Encounter (Signed)
Called to let py know Covid 19 test is back and is negative. Left voice mail message. MJB, MD

## 2019-03-28 NOTE — Telephone Encounter (Signed)
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 Technically, he is asymptomatic at this time and has a negative test so could return to work with mask and shield and maybe avoid working directly with the public a few more days. If he comes down with symptoms, can be re-tested.

## 2019-03-28 NOTE — Telephone Encounter (Signed)
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 He wants to know what he should do.

## 2019-03-29 NOTE — Telephone Encounter (Signed)
Patient got in touch with [facility] Dept and was told to isolate for ten days from 11/12 bc he had COVID symptoms on 11/12 and 11/13 low grade fever. He said if you think he should isolate he will but he will need a letter for work. He said he felt fine yesterday and today.

## 2019-03-29 NOTE — Telephone Encounter (Signed)
I think he should do what the health department says. Please type up letter excusing him from work.

## 2019-03-30 ENCOUNTER — Encounter: Payer: Self-pay | Admitting: Internal Medicine

## 2019-03-30 NOTE — Telephone Encounter (Signed)
Called and spoke with patient and he received letter thru [place].

## 2019-03-30 NOTE — Telephone Encounter (Signed)
Note typed, and sent thru Mychart

## 2019-07-07 ENCOUNTER — Other Ambulatory Visit: Payer: Self-pay

## 2019-07-07 ENCOUNTER — Other Ambulatory Visit: Payer: BC Managed Care – PPO | Admitting: Internal Medicine

## 2019-07-07 DIAGNOSIS — R7302 Impaired glucose tolerance (oral): Secondary | ICD-10-CM | POA: Diagnosis not present

## 2019-07-07 DIAGNOSIS — E7800 Pure hypercholesterolemia, unspecified: Secondary | ICD-10-CM | POA: Diagnosis not present

## 2019-07-08 LAB — LIPID PANEL
Cholesterol: 186 mg/dL (ref <200)
HDL: 55 mg/dL (ref >=40)
LDL Cholesterol (Calc): 112 mg/dL (calc) — ABNORMAL HIGH
Non-HDL Cholesterol (Calc): 131 mg/dL (calc) — ABNORMAL HIGH (ref <130)
Total CHOL/HDL Ratio: 3.4 (calc) (ref <5.0)
Triglycerides: 91 mg/dL (ref <150)

## 2019-07-08 LAB — HEMOGLOBIN A1C
Hgb A1c MFr Bld: 6.3 % of total Hgb — ABNORMAL HIGH (ref <5.7)
Mean Plasma Glucose: 134 (calc)
eAG (mmol/L): 7.4 (calc)

## 2019-07-12 ENCOUNTER — Other Ambulatory Visit: Payer: Self-pay

## 2019-07-12 ENCOUNTER — Ambulatory Visit (INDEPENDENT_AMBULATORY_CARE_PROVIDER_SITE_OTHER): Payer: BC Managed Care – PPO | Admitting: Internal Medicine

## 2019-07-12 ENCOUNTER — Encounter: Payer: Self-pay | Admitting: Internal Medicine

## 2019-07-12 VITALS — BP 120/80 | HR 87 | Temp 98.0°F | Ht 70.5 in | Wt 244.0 lb

## 2019-07-12 DIAGNOSIS — E7800 Pure hypercholesterolemia, unspecified: Secondary | ICD-10-CM

## 2019-07-12 DIAGNOSIS — R7302 Impaired glucose tolerance (oral): Secondary | ICD-10-CM | POA: Diagnosis not present

## 2019-07-12 DIAGNOSIS — I10 Essential (primary) hypertension: Secondary | ICD-10-CM

## 2019-07-12 DIAGNOSIS — Z6834 Body mass index (BMI) 34.0-34.9, adult: Secondary | ICD-10-CM

## 2019-07-12 DIAGNOSIS — Z20822 Contact with and (suspected) exposure to covid-19: Secondary | ICD-10-CM

## 2019-07-12 DIAGNOSIS — E8881 Metabolic syndrome: Secondary | ICD-10-CM

## 2019-07-12 NOTE — Progress Notes (Signed)
   Subjective:    Patient ID: Dominic Crafts., male    DOB: [birth year], 54 y.o.   MRN: [id number]  HPI 54 year old Male for 6 month recheck. Hx HTN, hyperlipidemia, and impaired glucose tolerance.  Meds include Metformin twice daily, Losartan-HCTZ 100/25 daily   Hgb AIC 6.3 %  LDL is 112 better than August 2020 when it was 131. He does not want to be on statin.  Had symptoms of Covid-19 and wife tested positive but patient tested negative. Has not had vaccine. Covid antibody drawn today and result is negative despite close contact with wife.  Review of Systems     Objective:   Physical Exam  Blood pressure excellent at 120/80, pulse 87, weight 244 pounds.  BMI 34.52. Skin warm and dry.  No cervical adenopathy.  No carotid bruits.  Chest clear to auscultation.  Cardiac exam regular rate and rhythm normal S1 and S2.  Extremities without edema.     Assessment & Plan:  Impaired glucose tolerance-stable on Metformin twice daily.  BMI 34.52-would benefit from diet, exercise, and weight loss  Hypertension-stable on current regimen and under excellent control  Hyperlipidemia-does not want to be on statin medication.  LDL remains mildly elevated at 112.  Close exposure to COVID-19 but never tested positive and has no antibody to COVID-19.  To get vaccine in the near future.

## 2019-07-13 LAB — SAR COV2 SEROLOGY (COVID19)AB(IGG),IA: SARS CoV2 AB IGG: NEGATIVE

## 2019-08-07 ENCOUNTER — Encounter: Payer: Self-pay | Admitting: Internal Medicine

## 2019-08-07 NOTE — Patient Instructions (Addendum)
COVID-19 antibody test is negative.  Please go ahead and get vaccine.  Please work on diet exercise and weight loss.  That would greatly benefit glucose intolerance, blood pressure and overall wellbeing.  LDL remains mildly elevated at 112.  Watch fat in diet.  Glucose control is adequate on Metformin at the present time but would benefit from weight loss.  Return in 6 months for health maintenance exam and fasting labs.

## 2019-09-24 ENCOUNTER — Other Ambulatory Visit: Payer: Self-pay | Admitting: Internal Medicine

## 2019-12-30 ENCOUNTER — Other Ambulatory Visit: Payer: Self-pay | Admitting: Internal Medicine

## 2019-12-30 ENCOUNTER — Other Ambulatory Visit: Payer: Self-pay

## 2019-12-30 DIAGNOSIS — E8881 Metabolic syndrome: Secondary | ICD-10-CM

## 2019-12-30 DIAGNOSIS — I10 Essential (primary) hypertension: Secondary | ICD-10-CM

## 2019-12-30 DIAGNOSIS — Z0000 Encounter for general adult medical examination without abnormal findings: Secondary | ICD-10-CM

## 2019-12-30 DIAGNOSIS — Z6834 Body mass index (BMI) 34.0-34.9, adult: Secondary | ICD-10-CM

## 2019-12-30 DIAGNOSIS — R7302 Impaired glucose tolerance (oral): Secondary | ICD-10-CM

## 2019-12-30 DIAGNOSIS — E7800 Pure hypercholesterolemia, unspecified: Secondary | ICD-10-CM

## 2019-12-30 DIAGNOSIS — Z125 Encounter for screening for malignant neoplasm of prostate: Secondary | ICD-10-CM

## 2019-12-31 LAB — COMPLETE METABOLIC PANEL WITH GFR
AG Ratio: 1.7 (calc) (ref 1.0–2.5)
ALT: 36 U/L (ref 9–46)
AST: 24 U/L (ref 10–35)
Albumin: 4.7 g/dL (ref 3.6–5.1)
Alkaline phosphatase (APISO): 34 U/L — ABNORMAL LOW (ref 35–144)
BUN: 19 mg/dL (ref 7–25)
CO2: 29 mmol/L (ref 20–32)
Calcium: 9.5 mg/dL (ref 8.6–10.3)
Chloride: 101 mmol/L (ref 98–110)
Creat: 0.99 mg/dL (ref 0.70–1.33)
GFR, Est African American: 99 mL/min/{1.73_m2} (ref >=60)
GFR, Est Non African American: 85 mL/min/{1.73_m2} (ref >=60)
Globulin: 2.8 g/dL (calc) (ref 1.9–3.7)
Glucose, Bld: 163 mg/dL — ABNORMAL HIGH (ref 65–99)
Potassium: 4.3 mmol/L (ref 3.5–5.3)
Sodium: 139 mmol/L (ref 135–146)
Total Bilirubin: 0.9 mg/dL (ref 0.2–1.2)
Total Protein: 7.5 g/dL (ref 6.1–8.1)

## 2019-12-31 LAB — HEMOGLOBIN A1C
Hgb A1c MFr Bld: 6.7 % of total Hgb — ABNORMAL HIGH (ref <5.7)
Mean Plasma Glucose: 146 (calc)
eAG (mmol/L): 8.1 (calc)

## 2019-12-31 LAB — CBC WITH DIFFERENTIAL/PLATELET
Absolute Monocytes: 576 cells/uL (ref 200–950)
Basophils Absolute: 57 cells/uL (ref 0–200)
Basophils Relative: 1 %
Eosinophils Absolute: 120 cells/uL (ref 15–500)
Eosinophils Relative: 2.1 %
HCT: 45 % (ref 38.5–50.0)
Hemoglobin: 15.2 g/dL (ref 13.2–17.1)
Lymphs Abs: 2086 cells/uL (ref 850–3900)
MCH: 30.6 pg (ref 27.0–33.0)
MCHC: 33.8 g/dL (ref 32.0–36.0)
MCV: 90.7 fL (ref 80.0–100.0)
MPV: 10.4 fL (ref 7.5–12.5)
Monocytes Relative: 10.1 %
Neutro Abs: 2861 cells/uL (ref 1500–7800)
Neutrophils Relative %: 50.2 %
Platelets: 289 10*3/uL (ref 140–400)
RBC: 4.96 10*6/uL (ref 4.20–5.80)
RDW: 12.7 % (ref 11.0–15.0)
Total Lymphocyte: 36.6 %
WBC: 5.7 10*3/uL (ref 3.8–10.8)

## 2019-12-31 LAB — PSA: PSA: 0.3 ng/mL (ref <=4.0)

## 2019-12-31 LAB — LIPID PANEL
Cholesterol: 195 mg/dL (ref <200)
HDL: 55 mg/dL (ref >=40)
LDL Cholesterol (Calc): 121 mg/dL (calc) — ABNORMAL HIGH
Non-HDL Cholesterol (Calc): 140 mg/dL (calc) — ABNORMAL HIGH (ref <130)
Total CHOL/HDL Ratio: 3.5 (calc) (ref <5.0)
Triglycerides: 87 mg/dL (ref <150)

## 2020-01-02 ENCOUNTER — Other Ambulatory Visit: Payer: Self-pay

## 2020-01-02 ENCOUNTER — Ambulatory Visit (INDEPENDENT_AMBULATORY_CARE_PROVIDER_SITE_OTHER): Payer: 59 | Admitting: Internal Medicine

## 2020-01-02 ENCOUNTER — Encounter: Payer: Self-pay | Admitting: Internal Medicine

## 2020-01-02 VITALS — BP 110/70 | HR 78 | Ht 70.5 in | Wt 249.0 lb

## 2020-01-02 DIAGNOSIS — E8881 Metabolic syndrome: Secondary | ICD-10-CM | POA: Diagnosis not present

## 2020-01-02 DIAGNOSIS — E7800 Pure hypercholesterolemia, unspecified: Secondary | ICD-10-CM

## 2020-01-02 DIAGNOSIS — I10 Essential (primary) hypertension: Secondary | ICD-10-CM | POA: Diagnosis not present

## 2020-01-02 DIAGNOSIS — Z6835 Body mass index (BMI) 35.0-35.9, adult: Secondary | ICD-10-CM | POA: Diagnosis not present

## 2020-01-02 DIAGNOSIS — R7302 Impaired glucose tolerance (oral): Secondary | ICD-10-CM

## 2020-01-02 DIAGNOSIS — Z8739 Personal history of other diseases of the musculoskeletal system and connective tissue: Secondary | ICD-10-CM

## 2020-01-02 DIAGNOSIS — Z0000 Encounter for general adult medical examination without abnormal findings: Secondary | ICD-10-CM | POA: Diagnosis not present

## 2020-01-02 LAB — POCT URINALYSIS DIPSTICK
Appearance: NEGATIVE
Bilirubin, UA: NEGATIVE
Blood, UA: NEGATIVE
Glucose, UA: NEGATIVE
Ketones, UA: NEGATIVE
Leukocytes, UA: NEGATIVE
Nitrite, UA: NEGATIVE
Odor: NEGATIVE
Protein, UA: NEGATIVE
Spec Grav, UA: 1.015 (ref 1.010–1.025)
Urobilinogen, UA: 0.2 E.U./dL
pH, UA: 6.5 (ref 5.0–8.0)

## 2020-01-02 NOTE — Progress Notes (Signed)
   Subjective:    Patient ID: Dominic Crafts., male    DOB: [birth year], 54 y.o.   MRN: [id number]  HPI 54 year old Male for health maintenance exam and evaluation of medical issues including hypertension, impaired glucose tolerance and obesity.  History of impaired glucose tolerance with Hgb AIC 6.7%.  History of GE reflux treated with over-the-counter Prilosec.  Penicillin causes a rash.  Past medical history: Appendectomy in the 1980s.   Weight was 238 pounds a year ago.  Now weighs 249 pounds.    He had colonoscopy by Dr. Loreta Ave several years ago before 09/03/09.  He needs to call Dr. Kenna Gilbert office and get scheduled for colonoscopy.  Social history: He is married.  No children.  Does not smoke.  Social alcohol consumption.  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  Wife works as Therapist, music.  Family history: Father with history of kidney stones and prostate cancer.  Mother with history of tremor requiring electrode implantation in the brain and was treated with Sinemet but was told she did not have Parkinson's disease.  She died in 09/03/2017 with complications of septicemia.  2 sisters in good health.  Review of Systems recent low back strain with yard work has Flexeril on hand gradually improving.  Previous episode March 2019.     Objective:   Physical Exam  BP 110/70 BMI, pulse 78, Pulse ox 98% BMI 35.22 weight 249 pounds.  Skin warm and dry.  No cervical adenopathy.  No thyromegaly.  No carotid bruits.  TMs are clear.  Chest clear to auscultation.  Cardiac exam regular rate and rhythm normal S1 and S2 without murmurs or gallops.  Abdomen soft nondistended without hepatosplenomegaly masses or tenderness.  Prostate is normal without nodules.  No lower extremity pitting edema.  Straight leg raising is negative at 90 degrees.  Muscle strength in the lower extremities is normal.  Cranial nerves III through XII are  grossly intact.  Thought affect and judgment are normal.        Assessment & Plan:  Essential hypertension-stable on current regimen of losartan HCTZ  History of recurrent low back pain-currently treated on a as needed basis with Flexeril  BMI 35.22 increased from 33.67 last year.  Needs to work on diet and exercise issues.  History of impaired glucose tolerance-stable on Metformin with hemoglobin A1c 6.7%  Family history of prostate cancer in father-PSA is normal at 0.3  Hyperlipidemia-LDL elevated at 121 and was 112 in February of this year.  However LDL was 131 in August 2020.  He does not want to be on statin medication.  Continue to work on diet exercise and follow-up in 6 months.  Health maintenance-immunizations: He received flu vaccine during his appointment as well and has had 2 COVID-19 immunizations.  He will need tetanus immunization update in future.

## 2020-01-07 NOTE — Patient Instructions (Addendum)
It was a pleasure to see you today.  Continue current medications.  Continue to work on diet and exercise and follow-up in 6 months.  Please call Dr. Loreta Ave regarding follow-up colonoscopy.

## 2020-01-14 ENCOUNTER — Other Ambulatory Visit: Payer: Self-pay | Admitting: Internal Medicine

## 2020-03-20 ENCOUNTER — Telehealth: Payer: Self-pay | Admitting: Internal Medicine

## 2020-03-20 ENCOUNTER — Other Ambulatory Visit: Payer: Self-pay

## 2020-03-20 ENCOUNTER — Ambulatory Visit (INDEPENDENT_AMBULATORY_CARE_PROVIDER_SITE_OTHER): Payer: 59 | Admitting: Internal Medicine

## 2020-03-20 ENCOUNTER — Encounter: Payer: Self-pay | Admitting: Internal Medicine

## 2020-03-20 VITALS — BP 120/80 | HR 90 | Temp 98.0°F | Ht 70.5 in | Wt 248.0 lb

## 2020-03-20 DIAGNOSIS — R350 Frequency of micturition: Secondary | ICD-10-CM | POA: Diagnosis not present

## 2020-03-20 DIAGNOSIS — R300 Dysuria: Secondary | ICD-10-CM | POA: Diagnosis not present

## 2020-03-20 DIAGNOSIS — N410 Acute prostatitis: Secondary | ICD-10-CM | POA: Diagnosis not present

## 2020-03-20 DIAGNOSIS — A498 Other bacterial infections of unspecified site: Secondary | ICD-10-CM | POA: Diagnosis not present

## 2020-03-20 LAB — CBC WITH DIFFERENTIAL/PLATELET
Absolute Monocytes: 937 cells/uL (ref 200–950)
Basophils Absolute: 106 cells/uL (ref 0–200)
Basophils Relative: 0.8 %
Eosinophils Absolute: 145 cells/uL (ref 15–500)
Eosinophils Relative: 1.1 %
HCT: 42.5 % (ref 38.5–50.0)
Hemoglobin: 14.7 g/dL (ref 13.2–17.1)
Lymphs Abs: 3392 cells/uL (ref 850–3900)
MCH: 31.2 pg (ref 27.0–33.0)
MCHC: 34.6 g/dL (ref 32.0–36.0)
MCV: 90.2 fL (ref 80.0–100.0)
MPV: 10.4 fL (ref 7.5–12.5)
Monocytes Relative: 7.1 %
Neutro Abs: 8620 cells/uL — ABNORMAL HIGH (ref 1500–7800)
Neutrophils Relative %: 65.3 %
Platelets: 317 10*3/uL (ref 140–400)
RBC: 4.71 10*6/uL (ref 4.20–5.80)
RDW: 12.1 % (ref 11.0–15.0)
Total Lymphocyte: 25.7 %
WBC: 13.2 10*3/uL — ABNORMAL HIGH (ref 3.8–10.8)

## 2020-03-20 MED ORDER — LEVOFLOXACIN 500 MG PO TABS: ORAL_TABLET | ORAL | 0 refills | Status: DC

## 2020-03-20 MED ORDER — CEFTRIAXONE SODIUM 1 G IJ SOLR
1.0000 g | Freq: Once | INTRAMUSCULAR | Status: AC
  Administered 2020-03-20: 1 g via INTRAMUSCULAR

## 2020-03-20 NOTE — Progress Notes (Signed)
   Subjective:    Patient ID: Dominic Garner., male    DOB: 1965-12-19, 54 y.o.   MRN: [id number]  HPI 54 year old Male seen today for follow-up on urinary tract infection diagnosed October 25 at [facility] urgent care on [address] here in [place] treated with Bactrim.  Culture grew greater than 100,000 colonies per milliliter of E. coli sensitive to Bactrim sensitivity was less than or equal to 20.  He had suprapubic tenderness on exam and 3+ occult blood in his urine specimen.  He called today saying he was having some urinary urgency pain and dysuria with frequency.    Review of Systems denies nausea or vomiting.  No shaking chills.     Objective:   Physical Exam He is afebrile.  Temperature 98 degrees.  Pulse 90.  Blood pressure 120/80 weight 248 pounds height 5 feet 10.5 inches BMI 35.08.  He has no significant CVA tenderness.  He is in no acute distress.  He has taken Azo-Standard so dipstick urine specimen cannot be done.  He is urine was sent for microscopic and had 20-40 white blood cells per high-powered field and a few bacteria.  Subsequently urine culture grew E. coli sensitive to Cipro.  Sensitivity was less than 0.25.  CBC showed white blood cell count elevated at 13,200.  Hemoglobin 14.7 g and platelet count 317,000. Prostate is boggy on exam.      Assessment & Plan:  Acute prostatitis-symptomatic with boggy prostate, urinary symptoms, elevated white blood cell count.  Likely did not clear initial urinary infection in late October when he was treated at an urgent care.  Leukocytosis secondary to acute prostatitis  E. coli urinary tract infection  Plan: 1 g IM Rocephin.  Levaquin 500 mg daily for 14 days.  Follow-up April 13, 2020 or sooner if not improving.

## 2020-03-20 NOTE — Telephone Encounter (Signed)
scheduled

## 2020-03-20 NOTE — Telephone Encounter (Signed)
See today

## 2020-03-20 NOTE — Telephone Encounter (Signed)
Dominic Garner [phone]  Dominic Garner is having urgency,some pain, slight burning, and going a lot. He went to [facility] 2 weeks ago and was diagnosed with an Ecoli Infection and he was given some antibiotics, but he thinks it is back or did not go completely away. I have looked, can not find anything from the Urgent Care.

## 2020-03-23 LAB — URINE CULTURE
MICRO NUMBER:: 11179594
SPECIMEN QUALITY:: ADEQUATE

## 2020-03-23 LAB — URINALYSIS, MICROSCOPIC ONLY
Hyaline Cast: NONE SEEN /LPF
Squamous Epithelial / HPF: NONE SEEN /HPF (ref <=5)

## 2020-03-29 ENCOUNTER — Other Ambulatory Visit: Payer: Self-pay | Admitting: Internal Medicine

## 2020-04-12 ENCOUNTER — Ambulatory Visit: Payer: 59 | Admitting: Internal Medicine

## 2020-04-13 ENCOUNTER — Ambulatory Visit (INDEPENDENT_AMBULATORY_CARE_PROVIDER_SITE_OTHER): Payer: 59 | Admitting: Internal Medicine

## 2020-04-13 ENCOUNTER — Other Ambulatory Visit: Payer: Self-pay

## 2020-04-13 VITALS — BP 120/80 | HR 85 | Temp 98.7°F | Ht 70.5 in | Wt 250.0 lb

## 2020-04-13 DIAGNOSIS — R8290 Unspecified abnormal findings in urine: Secondary | ICD-10-CM

## 2020-04-13 DIAGNOSIS — R7302 Impaired glucose tolerance (oral): Secondary | ICD-10-CM | POA: Diagnosis not present

## 2020-04-13 DIAGNOSIS — N390 Urinary tract infection, site not specified: Secondary | ICD-10-CM

## 2020-04-13 DIAGNOSIS — N410 Acute prostatitis: Secondary | ICD-10-CM | POA: Diagnosis not present

## 2020-04-13 DIAGNOSIS — B9620 Unspecified Escherichia coli [E. coli] as the cause of diseases classified elsewhere: Secondary | ICD-10-CM

## 2020-04-13 DIAGNOSIS — Z87438 Personal history of other diseases of male genital organs: Secondary | ICD-10-CM

## 2020-04-13 LAB — POCT URINALYSIS DIPSTICK
Appearance: NEGATIVE
Bilirubin, UA: NEGATIVE
Blood, UA: NEGATIVE
Glucose, UA: NEGATIVE
Ketones, UA: NEGATIVE
Leukocytes, UA: NEGATIVE
Nitrite, UA: NEGATIVE
Odor: NEGATIVE
Protein, UA: NEGATIVE
Spec Grav, UA: 1.01 (ref 1.010–1.025)
Urobilinogen, UA: 0.2 E.U./dL
pH, UA: 6.5 (ref 5.0–8.0)

## 2020-04-13 NOTE — Progress Notes (Signed)
   Subjective:    Patient ID: Dominic Crafts., male    DOB: 02/03/1966, 54 y.o.   MRN: [id number]  HPI 54 year old Male for follow up on prostatitis.   He was seen here for physical exam in August 2021 and urine specimen was normal.  He called November 9 complaining of urinary issues.  Had been seen at urgent care on October 25 treated with Bactrim and urine culture grew greater than 100 colonies per milliliter E. coli sensitive to Bactrim.  On November 9 he was seen acutely having urinary urgency, pain and dysuria with frequency.  Urine specimen was abnormal with 20-40 white blood cells per high-powered field.  He had taken Azo-Standard so urine dipstick could not be done in the office.  Urine culture grew E. coli sensitive to Cipro.  White blood cell count was elevated at 13,200.  Prostate was noted to be boggy on exam.  He was afebrile at the time and had no shaking chills.  He was treated with Levaquin 500 mg daily for 14 days.  He is now seen for follow-up and is feeling better.  His urine dipstick is completely normal with no evidence of blood, nitrite or white blood cells.    Review of Systems  no dysuria, no frequency, no nausea, no fever or chills, no hematuria, no back pain     Objective:   Physical Exam He is afebrile in the office.  Blood pressure 120/80 pulse 85 regular pulse oximetry 97% weight 250 pounds.  Previously had boggy prostate but it is now within normal limits.  Urine dipstick is completely normal.  Culture was not sent because of normal urine dipstick.       Assessment & Plan:  Acute prostatitis-resolved.  He had an E. coli infection sensitive to Levaquin.  Essential hypertension stable on losartan HCTZ  Impaired glucose tolerance treated with Metformin and last hemoglobin A1c in August at time of physical exam was stable at 6.7%  Plan: Return as needed.  His 36-month recheck is due in February and he has an appointment for that.

## 2020-04-29 ENCOUNTER — Encounter: Payer: Self-pay | Admitting: Internal Medicine

## 2020-04-29 DIAGNOSIS — Z87438 Personal history of other diseases of male genital organs: Secondary | ICD-10-CM | POA: Insufficient documentation

## 2020-04-29 NOTE — Patient Instructions (Signed)
I believe you have acute prostatitis.  You have been given 1 g IM Rocephin today in the office and started on Levaquin 500 mg daily for 14 days.  Follow-up December 3 or sooner if not improving.

## 2020-07-03 ENCOUNTER — Other Ambulatory Visit: Payer: 59 | Admitting: Internal Medicine

## 2020-07-03 ENCOUNTER — Other Ambulatory Visit: Payer: Self-pay

## 2020-07-03 DIAGNOSIS — E7800 Pure hypercholesterolemia, unspecified: Secondary | ICD-10-CM

## 2020-07-03 DIAGNOSIS — R8290 Unspecified abnormal findings in urine: Secondary | ICD-10-CM

## 2020-07-03 DIAGNOSIS — R7302 Impaired glucose tolerance (oral): Secondary | ICD-10-CM

## 2020-07-03 DIAGNOSIS — I10 Essential (primary) hypertension: Secondary | ICD-10-CM

## 2020-07-04 LAB — HEMOGLOBIN A1C
Hgb A1c MFr Bld: 7.7 % of total Hgb — ABNORMAL HIGH (ref <5.7)
Mean Plasma Glucose: 174 mg/dL
eAG (mmol/L): 9.7 mmol/L

## 2020-07-04 LAB — LIPID PANEL
Cholesterol: 197 mg/dL (ref <200)
HDL: 45 mg/dL (ref >=40)
LDL Cholesterol (Calc): 130 mg/dL (calc) — ABNORMAL HIGH
Non-HDL Cholesterol (Calc): 152 mg/dL (calc) — ABNORMAL HIGH (ref <130)
Total CHOL/HDL Ratio: 4.4 (calc) (ref <5.0)
Triglycerides: 111 mg/dL (ref <150)

## 2020-07-04 LAB — MICROALBUMIN / CREATININE URINE RATIO
Creatinine, Urine: 161 mg/dL (ref 20–320)
Microalb Creat Ratio: 5 mcg/mg creat (ref <30)
Microalb, Ur: 0.8 mg/dL

## 2020-07-04 LAB — BASIC METABOLIC PANEL
BUN: 16 mg/dL (ref 7–25)
CO2: 27 mmol/L (ref 20–32)
Calcium: 9.7 mg/dL (ref 8.6–10.3)
Chloride: 99 mmol/L (ref 98–110)
Creat: 0.92 mg/dL (ref 0.70–1.33)
Glucose, Bld: 194 mg/dL — ABNORMAL HIGH (ref 65–99)
Potassium: 4.4 mmol/L (ref 3.5–5.3)
Sodium: 137 mmol/L (ref 135–146)

## 2020-07-06 ENCOUNTER — Ambulatory Visit (INDEPENDENT_AMBULATORY_CARE_PROVIDER_SITE_OTHER): Payer: 59 | Admitting: Internal Medicine

## 2020-07-06 ENCOUNTER — Other Ambulatory Visit: Payer: Self-pay

## 2020-07-06 ENCOUNTER — Encounter: Payer: Self-pay | Admitting: Internal Medicine

## 2020-07-06 VITALS — BP 120/80 | HR 88 | Ht 70.5 in | Wt 247.0 lb

## 2020-07-06 DIAGNOSIS — E7800 Pure hypercholesterolemia, unspecified: Secondary | ICD-10-CM | POA: Diagnosis not present

## 2020-07-06 DIAGNOSIS — Z6834 Body mass index (BMI) 34.0-34.9, adult: Secondary | ICD-10-CM

## 2020-07-06 DIAGNOSIS — R7302 Impaired glucose tolerance (oral): Secondary | ICD-10-CM

## 2020-07-06 DIAGNOSIS — R8290 Unspecified abnormal findings in urine: Secondary | ICD-10-CM

## 2020-07-06 MED ORDER — METFORMIN HCL 500 MG PO TABS: ORAL_TABLET | ORAL | 2 refills | Status: DC

## 2020-07-06 NOTE — Progress Notes (Signed)
   Subjective:    Patient ID: Dominic Crafts., male    DOB: 13-Jan-1966, 55 y.o.   MRN: [id number]  HPI  55 year old Male seen for 6 month recheck. Had Covid-19 recently. This is second time he has had it. Also, had Covid-19 in November 2020 but that was a mild case. Has not had booster. Needs to do so in a few weeks.   Needs eye exam with history of glucose intolerance.  History of hypertension and obesity.  Blood pressure treated with Hyzaar 100/25 daily.  On Metformin for impaired glucose tolerance.  Takes Prilosec for GE reflux.  Lipid panel shows total cholesterol being 197, HDL 45, triglycerides 111 and LDL cholesterol 130.  In August LDL was 121.  Consideration needs to be given to statin medication but he does not want to be on it at this time.  We will try diet and exercise and reevaluate in 6 months.  Hemoglobin A1c has increased from 6.7% in August 20 21 to 7.7%.  We are going to increase Metformin to 2 tabs in the morning and 1 tab at supper.  For total of 1500 mg daily.  He will follow-up in May.  Fasting glucose is 194.  BUN and creatinine are normal.  Review of Systems no new complaints     Objective:   Physical Exam Blood pressure is excellent at 120/80, pulse 78, pulse oximetry 97% on room air.  Weight 247 pounds.  BMI 34.94 has lost 2 pounds since August 2021.  Skin is warm and dry.  No cervical adenopathy.  No thyromegaly.  No carotid bruits.  Chest is clear to auscultation.  Cardiac exam regular rate and rhythm.  Normal S1 and S2 without murmurs or gallops.  No lower extremity pitting edema.     Assessment & Plan:  BMI 34.94-needs to work on diet exercise and weight loss  Hemoglobin A1c 7.7%.  Increase Metformin to 1000 mg in the morning and 500 mg at supper.  Follow-up in May.  Elevated LDL-needs to give consideration to lipid medication since he has impaired glucose tolerance.  He will work on his diet.  We can repeat this in May as well.  History of  COVID-19-recently had second episode.  Needs to get booster in a few weeks.  Essential hypertension-stable on current medication

## 2020-07-06 NOTE — Patient Instructions (Addendum)
It was a pleasure to see you today.Work on diet and exercise. Increase Metformin to 2 x 500 tabs in am and 500 mg at supper. RTC in May.  Watch carbohydrates and fat in diet.

## 2020-10-01 ENCOUNTER — Other Ambulatory Visit: Payer: Self-pay

## 2020-10-01 ENCOUNTER — Other Ambulatory Visit: Payer: 59 | Admitting: Internal Medicine

## 2020-10-01 DIAGNOSIS — R7302 Impaired glucose tolerance (oral): Secondary | ICD-10-CM

## 2020-10-02 LAB — HEMOGLOBIN A1C
Hgb A1c MFr Bld: 7.4 % of total Hgb — ABNORMAL HIGH (ref <5.7)
Mean Plasma Glucose: 166 mg/dL
eAG (mmol/L): 9.2 mmol/L

## 2020-10-04 ENCOUNTER — Other Ambulatory Visit: Payer: Self-pay

## 2020-10-04 ENCOUNTER — Ambulatory Visit (INDEPENDENT_AMBULATORY_CARE_PROVIDER_SITE_OTHER): Payer: 59 | Admitting: Internal Medicine

## 2020-10-04 VITALS — BP 100/70 | HR 85 | Ht 71.0 in | Wt 244.0 lb

## 2020-10-04 DIAGNOSIS — E119 Type 2 diabetes mellitus without complications: Secondary | ICD-10-CM | POA: Diagnosis not present

## 2020-10-04 DIAGNOSIS — Z6834 Body mass index (BMI) 34.0-34.9, adult: Secondary | ICD-10-CM | POA: Diagnosis not present

## 2020-10-04 DIAGNOSIS — R8290 Unspecified abnormal findings in urine: Secondary | ICD-10-CM

## 2020-10-04 DIAGNOSIS — Z23 Encounter for immunization: Secondary | ICD-10-CM

## 2020-10-04 MED ORDER — SAXAGLIPTIN HCL 5 MG PO TABS: 5.0000 mg | ORAL_TABLET | Freq: Every day | ORAL | 1 refills | Status: DC

## 2020-10-04 MED ORDER — SITAGLIPTIN PHOSPHATE 50 MG PO TABS: 50.0000 mg | ORAL_TABLET | Freq: Every day | ORAL | 1 refills | Status: DC

## 2020-10-04 NOTE — Progress Notes (Signed)
   Subjective:    Patient ID: Dominic Crafts., male    DOB: [birth year], 55 y.o.   MRN: [id number]  HPI Pleasant 55 year old Male in today for follow-up on impaired glucose tolerance.  He also has a history of hypertension.  Currently on metformin for glucose intolerance.  He has a history of hyperlipidemia as well.  Has not wanted to be on statin medication.  His BMI is 34.03 and weight is 244 pounds.  Currently on metformin 500 mg 2 tablets in the morning and 1 tablet at supper.  He tolerates this well and has no issues with diarrhea.  He is on losartan HCTZ 100/25 daily for hypertension.  Blood pressure is excellent at 100/70    Review of Systems no new complaints     Objective:   Physical Exam Weight is 244 pounds BMI 34.03 height 5 feet 11 inches pulse oximetry 97% pulse 85 regular blood pressure 100/70  Skin warm and dry.  No cervical adenopathy.  No thyromegaly.  No carotid bruits.  Chest clear to auscultation.  Cardiac exam: Regular rate and rhythm.  No lower extremity pitting edema.       Assessment & Plan:  Type 2 diabetes mellitus-hemoglobin A1c 7.4% and was 7.7% in February.  Prior to that it was 6.7% in August 2021.  Lowest A1c over the past 2 years was 6% in August 2020.  We are going to add Onglyza 5 mg by mouth every morning and continue with metformin 1000 mg in the morning and 500 mg at supper and he will follow-up in July.  Hypertension-blood pressure is stable on current regimen of losartan HCTZ 100/25 daily  BMI 34-needs to work on diet exercise and weight loss.  History of GE reflux treated with over-the-counter Prilosec.

## 2020-10-04 NOTE — Progress Notes (Deleted)
   Subjective:    Patient ID: Dominic Crafts., male    DOB: [birth year], 55 y.o.   MRN: [id number]  HPI  55 year old Male for follow up of diabetes.    Review of Systems      Objective:   Physical Exam        Assessment & Plan:

## 2020-10-04 NOTE — Patient Instructions (Addendum)
Your insurance will cover Onglyza rather than Januvia.  Take Onglyza 5 mg every morning.  Continue metformin.  Continue antihypertensive medication.  Blood pressure is stable.  RTC in 6 weeks.

## 2020-10-10 ENCOUNTER — Other Ambulatory Visit: Payer: Self-pay | Admitting: Internal Medicine

## 2020-11-16 ENCOUNTER — Ambulatory Visit (INDEPENDENT_AMBULATORY_CARE_PROVIDER_SITE_OTHER): Payer: 59 | Admitting: Internal Medicine

## 2020-11-16 ENCOUNTER — Other Ambulatory Visit: Payer: Self-pay

## 2020-11-16 ENCOUNTER — Encounter: Payer: Self-pay | Admitting: Internal Medicine

## 2020-11-16 VITALS — BP 100/70 | HR 96 | Ht 71.0 in | Wt 242.0 lb

## 2020-11-16 DIAGNOSIS — Z6833 Body mass index (BMI) 33.0-33.9, adult: Secondary | ICD-10-CM | POA: Diagnosis not present

## 2020-11-16 DIAGNOSIS — E119 Type 2 diabetes mellitus without complications: Secondary | ICD-10-CM | POA: Diagnosis not present

## 2020-11-16 DIAGNOSIS — R8290 Unspecified abnormal findings in urine: Secondary | ICD-10-CM | POA: Diagnosis not present

## 2020-11-16 NOTE — Progress Notes (Signed)
   Subjective:    Patient ID: Dominic Crafts., male    DOB: 1965-05-22, 55 y.o.   MRN: [id number]  HPI 55 year old Male for follow up on diabetes mellitus. [place] Trying to follow calorie restricted diet. Enjoyed boating while on vacation. Weight management discussed.  Per our records he needs a COVID booster in the near future.  Tetanus immunization update given in May 2022.  At last visit and was started on generic Onglyza 5 mg daily.  He has done well with this.  Remains on metformin 1000 mg in the morning and 500 mg at dinner.  His hemoglobin A1c is excellent now at 6.5% and was 7.4%.  He can continue to work on diet exercise and weight loss and this may get even better.  In February 2022 his hemoglobin A1c had risen to 7.7%.  He is due for health maintenance exam in August.  We can reassess at that time.    Review of Systems     Objective:   Physical Exam His blood pressure is excellent at 100/70 pulse 96 regular pulse oximetry 97% weight 242 pounds BMI 33.7.  In February he weighed 247 pounds.  Not examined today but diet exercise and weight loss discussed.  He will have health maintenance exam in August.       Assessment & Plan:  Type 2 diabetes mellitus improved with the addition of Onglyza to his metformin regimen.  Essential hypertension-under excellent control on current regimen.  Plan: Return to the office for health maintenance exam.  He has never had screening colonoscopy.

## 2020-11-17 ENCOUNTER — Encounter: Payer: Self-pay | Admitting: Internal Medicine

## 2020-11-17 LAB — HEMOGLOBIN A1C
Hgb A1c MFr Bld: 6.5 % of total Hgb — ABNORMAL HIGH (ref <5.7)
Mean Plasma Glucose: 140 mg/dL
eAG (mmol/L): 7.7 mmol/L

## 2020-11-17 NOTE — Patient Instructions (Signed)
It was a pleasure to see you today.  Diabetes seems to be under better control with improved hemoglobin A1c with the addition of Onglyza to metformin.  Return in August for health maintenance exam.

## 2020-12-01 ENCOUNTER — Other Ambulatory Visit: Payer: Self-pay | Admitting: Internal Medicine

## 2021-01-03 ENCOUNTER — Other Ambulatory Visit: Payer: 59 | Admitting: Internal Medicine

## 2021-01-08 ENCOUNTER — Encounter: Payer: 59 | Admitting: Internal Medicine

## 2021-04-11 ENCOUNTER — Other Ambulatory Visit: Payer: Self-pay | Admitting: Internal Medicine

## 2021-04-25 ENCOUNTER — Other Ambulatory Visit: Payer: Self-pay

## 2021-04-25 ENCOUNTER — Other Ambulatory Visit: Payer: 59 | Admitting: Internal Medicine

## 2021-04-25 DIAGNOSIS — E7800 Pure hypercholesterolemia, unspecified: Secondary | ICD-10-CM

## 2021-04-25 DIAGNOSIS — Z125 Encounter for screening for malignant neoplasm of prostate: Secondary | ICD-10-CM

## 2021-04-25 DIAGNOSIS — I10 Essential (primary) hypertension: Secondary | ICD-10-CM

## 2021-04-25 DIAGNOSIS — E119 Type 2 diabetes mellitus without complications: Secondary | ICD-10-CM

## 2021-04-26 ENCOUNTER — Ambulatory Visit (INDEPENDENT_AMBULATORY_CARE_PROVIDER_SITE_OTHER): Payer: 59 | Admitting: Internal Medicine

## 2021-04-26 ENCOUNTER — Encounter: Payer: Self-pay | Admitting: Internal Medicine

## 2021-04-26 VITALS — BP 108/70 | HR 90 | Temp 98.4°F | Ht 72.25 in | Wt 245.0 lb

## 2021-04-26 DIAGNOSIS — E7800 Pure hypercholesterolemia, unspecified: Secondary | ICD-10-CM | POA: Diagnosis not present

## 2021-04-26 DIAGNOSIS — Z0000 Encounter for general adult medical examination without abnormal findings: Secondary | ICD-10-CM | POA: Diagnosis not present

## 2021-04-26 DIAGNOSIS — R8290 Unspecified abnormal findings in urine: Secondary | ICD-10-CM

## 2021-04-26 DIAGNOSIS — Z6833 Body mass index (BMI) 33.0-33.9, adult: Secondary | ICD-10-CM | POA: Diagnosis not present

## 2021-04-26 DIAGNOSIS — E119 Type 2 diabetes mellitus without complications: Secondary | ICD-10-CM | POA: Diagnosis not present

## 2021-04-26 DIAGNOSIS — K219 Gastro-esophageal reflux disease without esophagitis: Secondary | ICD-10-CM

## 2021-04-26 LAB — MICROALBUMIN / CREATININE URINE RATIO
Creatinine, Urine: 128 mg/dL (ref 20–320)
Microalb Creat Ratio: 5 mcg/mg creat (ref <30)
Microalb, Ur: 0.6 mg/dL

## 2021-04-26 LAB — CBC WITH DIFFERENTIAL/PLATELET
Absolute Monocytes: 637 cells/uL (ref 200–950)
Basophils Absolute: 70 cells/uL (ref 0–200)
Basophils Relative: 1.3 %
Eosinophils Absolute: 81 cells/uL (ref 15–500)
Eosinophils Relative: 1.5 %
HCT: 45.1 % (ref 38.5–50.0)
Hemoglobin: 15.3 g/dL (ref 13.2–17.1)
Lymphs Abs: 1674 cells/uL (ref 850–3900)
MCH: 30.4 pg (ref 27.0–33.0)
MCHC: 33.9 g/dL (ref 32.0–36.0)
MCV: 89.7 fL (ref 80.0–100.0)
MPV: 10.4 fL (ref 7.5–12.5)
Monocytes Relative: 11.8 %
Neutro Abs: 2938 cells/uL (ref 1500–7800)
Neutrophils Relative %: 54.4 %
Platelets: 296 10*3/uL (ref 140–400)
RBC: 5.03 10*6/uL (ref 4.20–5.80)
RDW: 12.9 % (ref 11.0–15.0)
Total Lymphocyte: 31 %
WBC: 5.4 10*3/uL (ref 3.8–10.8)

## 2021-04-26 LAB — COMPLETE METABOLIC PANEL WITH GFR
AG Ratio: 1.6 (calc) (ref 1.0–2.5)
ALT: 22 U/L (ref 9–46)
AST: 22 U/L (ref 10–35)
Albumin: 4.7 g/dL (ref 3.6–5.1)
Alkaline phosphatase (APISO): 35 U/L (ref 35–144)
BUN: 15 mg/dL (ref 7–25)
CO2: 30 mmol/L (ref 20–32)
Calcium: 9.7 mg/dL (ref 8.6–10.3)
Chloride: 99 mmol/L (ref 98–110)
Creat: 0.81 mg/dL (ref 0.70–1.30)
Globulin: 2.9 g/dL (calc) (ref 1.9–3.7)
Glucose, Bld: 158 mg/dL — ABNORMAL HIGH (ref 65–99)
Potassium: 4 mmol/L (ref 3.5–5.3)
Sodium: 138 mmol/L (ref 135–146)
Total Bilirubin: 1 mg/dL (ref 0.2–1.2)
Total Protein: 7.6 g/dL (ref 6.1–8.1)
eGFR: 103 mL/min/{1.73_m2} (ref >=60)

## 2021-04-26 LAB — LIPID PANEL
Cholesterol: 213 mg/dL — ABNORMAL HIGH (ref <200)
HDL: 54 mg/dL (ref >=40)
LDL Cholesterol (Calc): 142 mg/dL (calc) — ABNORMAL HIGH
Non-HDL Cholesterol (Calc): 159 mg/dL (calc) — ABNORMAL HIGH (ref <130)
Total CHOL/HDL Ratio: 3.9 (calc) (ref <5.0)
Triglycerides: 76 mg/dL (ref <150)

## 2021-04-26 LAB — HEMOGLOBIN A1C
Hgb A1c MFr Bld: 6.3 % of total Hgb — ABNORMAL HIGH (ref <5.7)
Mean Plasma Glucose: 134 mg/dL
eAG (mmol/L): 7.4 mmol/L

## 2021-04-26 LAB — PSA: PSA: 0.47 ng/mL (ref <=4.00)

## 2021-04-26 NOTE — Progress Notes (Signed)
Subjective:    Patient ID: Dominic Garner., male    DOB: 1965-10-30, 55 y.o.   MRN: [id number]  HPI 55 year old Male seen for health maintenance exam and evaluation of medical issues including hypertension, impaired glucose tolerance and obesity.  Currently hemoglobin A1c is 6.3% and was 6.5% in July.  He is on metformin and Onglyza.  He takes 2 tablets of metformin in the morning and 1 in the evening.  He takes 5 mg of Onglyza daily.  For hypertension he takes losartan HCTZ 100/25 daily.  For GE reflux takes omeprazole 10 mg daily.  Penicillin causes a rash.  Past medical history: Had appendectomy in the 1980s.  Continues to struggle with weight.  BMI is 33 and weight is 245 pounds.  In August 2021 his weight was 249 pounds.  He is due for colonoscopy and had one in 09/01/09 by Dr. Loreta Ave.  Social history: He is married.  No children.  Does not smoke.  Social alcohol consumption.  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  His wife works as Therapist, music  Family history: Father with history of kidney stones and prostate cancer.  Mother with history of tremor requiring electrode implantation in the brain and was treated with Sinemet but was told she did not have Parkinson's disease.  She died in 01-Sep-2017 with complications of septicemia.  He has 2 sisters in good health.    Review of Systems history of low back pain in 09/02/19 with yard work.  Previous episode in 09-01-2017.     Objective:   Physical Exam Constitutional:      General: He is not in acute distress.    Appearance: Normal appearance.  HENT:     Head: Normocephalic and atraumatic.     Right Ear: Tympanic membrane normal.     Left Ear: Tympanic membrane normal.     Nose: Nose normal.     Mouth/Throat:     Mouth: Mucous membranes are moist.     Pharynx: Oropharynx is clear.  Eyes:     General: No scleral icterus.       Right eye: No discharge.         Left eye: No discharge.     Extraocular Movements: Extraocular movements intact.     Conjunctiva/sclera: Conjunctivae normal.     Pupils: Pupils are equal, round, and reactive to light.  Neck:     Vascular: No carotid bruit.  Cardiovascular:     Rate and Rhythm: Normal rate and regular rhythm.     Heart sounds: Normal heart sounds. No murmur heard. Pulmonary:     Effort: Pulmonary effort is normal.     Breath sounds: Normal breath sounds. No wheezing or rales.  Abdominal:     General: Bowel sounds are normal.     Palpations: Abdomen is soft.     Tenderness: There is no abdominal tenderness. There is no guarding or rebound.     Hernia: No hernia is present.  Genitourinary:    Prostate: Normal.  Musculoskeletal:     Cervical back: Neck supple. No rigidity.     Right lower leg: No edema.     Left lower leg: No edema.  Lymphadenopathy:     Cervical: No cervical adenopathy.  Skin:    General: Skin is warm and dry.     Findings: No rash.  Neurological:     General: No focal deficit  present.     Mental Status: He is alert and oriented to person, place, and time.     Cranial Nerves: No cranial nerve deficit.     Sensory: No sensory deficit.     Motor: No weakness.     Coordination: Coordination normal.  Psychiatric:        Mood and Affect: Mood normal.        Behavior: Behavior normal.        Thought Content: Thought content normal.        Judgment: Judgment normal.   Vital signs: Blood pressure 108/70 pulse 90 temperature 98.4 degrees pulse oximetry 96% weight 245 pounds BMI 33.00       Assessment & Plan:  BMI 33-continue to work on diet exercise and weight loss  Essential hypertension-stable with losartan HCTZ 100/25 daily  Type 2 diabetes mellitus treated with Onglyza 5 mg daily and metformin 1000 mg in the morning and 500 mg in the evening  GE reflux treated with over-the-counter Prilosec  Family history of prostate cancer in father  Pure hypercholesterolemia-does  not want to be on statin medication.  His total cholesterol is 213 and LDL is 142.  He should consider coronary calcium scoring to evaluate degree of atherosclerosis.  In February 2022 LDL was 130 and total cholesterol was normal at 197.  Follow-up in 6 months.  Immunizations reviewed.  Recommend COVID booster.  Tetanus immunization and flu vaccine up-to-date.  Consider Shingrix vaccine  Plan:

## 2021-04-26 NOTE — Progress Notes (Deleted)
° °  Subjective:    Patient ID: Dominic Crafts., male    DOB: 1965/05/20, 55 y.o.   MRN: [id number]  HPI    Review of Systems     Objective:   Physical Exam        Assessment & Plan:

## 2021-05-31 ENCOUNTER — Other Ambulatory Visit: Payer: Self-pay | Admitting: Internal Medicine

## 2021-06-08 NOTE — Patient Instructions (Addendum)
It was a pleasure to see you today.  Continue to work on diet exercise and weight loss.  Continue current medications.  Consider coronary calcium scoring to assess degree of atherosclerosis with hyperlipidemia.  Still would encourage statin therapy.  Follow-up in June 2023.

## 2021-10-10 ENCOUNTER — Other Ambulatory Visit: Payer: Self-pay | Admitting: Internal Medicine

## 2021-10-11 ENCOUNTER — Emergency Department (HOSPITAL_COMMUNITY): Payer: 59

## 2021-10-11 ENCOUNTER — Encounter (HOSPITAL_COMMUNITY): Payer: Self-pay

## 2021-10-11 ENCOUNTER — Emergency Department (HOSPITAL_COMMUNITY)
Admission: EM | Admit: 2021-10-11 | Discharge: 2021-10-12 | Disposition: A | Discharge status: Home / Self Care | Payer: 59 | Attending: Emergency Medicine | Admitting: Emergency Medicine

## 2021-10-11 DIAGNOSIS — R079 Chest pain, unspecified: Secondary | ICD-10-CM | POA: Diagnosis not present

## 2021-10-11 DIAGNOSIS — K8021 Calculus of gallbladder without cholecystitis with obstruction: Secondary | ICD-10-CM

## 2021-10-11 DIAGNOSIS — Z79899 Other long term (current) drug therapy: Secondary | ICD-10-CM | POA: Diagnosis not present

## 2021-10-11 DIAGNOSIS — K8020 Calculus of gallbladder without cholecystitis without obstruction: Secondary | ICD-10-CM | POA: Insufficient documentation

## 2021-10-11 DIAGNOSIS — R1013 Epigastric pain: Secondary | ICD-10-CM | POA: Diagnosis present

## 2021-10-11 DIAGNOSIS — I10 Essential (primary) hypertension: Secondary | ICD-10-CM | POA: Diagnosis not present

## 2021-10-11 LAB — URINALYSIS, ROUTINE W REFLEX MICROSCOPIC
Bilirubin Urine: NEGATIVE
Glucose, UA: NEGATIVE mg/dL
Hgb urine dipstick: NEGATIVE
Ketones, ur: 80 mg/dL — AB
Leukocytes,Ua: NEGATIVE
Nitrite: NEGATIVE
Protein, ur: NEGATIVE mg/dL
Specific Gravity, Urine: 1.024 (ref 1.005–1.030)
pH: 7 (ref 5.0–8.0)

## 2021-10-11 LAB — COMPREHENSIVE METABOLIC PANEL
ALT: 32 U/L (ref 0–44)
AST: 27 U/L (ref 15–41)
Albumin: 4.4 g/dL (ref 3.5–5.0)
Alkaline Phosphatase: 35 U/L — ABNORMAL LOW (ref 38–126)
Anion gap: 10 (ref 5–15)
BUN: 16 mg/dL (ref 6–20)
CO2: 27 mmol/L (ref 22–32)
Calcium: 10.1 mg/dL (ref 8.9–10.3)
Chloride: 100 mmol/L (ref 98–111)
Creatinine, Ser: 1 mg/dL (ref 0.61–1.24)
GFR, Estimated: >60 mL/min (ref >60)
Glucose, Bld: 180 mg/dL — ABNORMAL HIGH (ref 70–99)
Potassium: 3.7 mmol/L (ref 3.5–5.1)
Sodium: 137 mmol/L (ref 135–145)
Total Bilirubin: 1.2 mg/dL (ref 0.3–1.2)
Total Protein: 7.6 g/dL (ref 6.5–8.1)

## 2021-10-11 LAB — CBC
HCT: 44.5 % (ref 39.0–52.0)
Hemoglobin: 15.8 g/dL (ref 13.0–17.0)
MCH: 31.3 pg (ref 26.0–34.0)
MCHC: 35.5 g/dL (ref 30.0–36.0)
MCV: 88.3 fL (ref 80.0–100.0)
Platelets: 266 10*3/uL (ref 150–400)
RBC: 5.04 MIL/uL (ref 4.22–5.81)
RDW: 13.4 % (ref 11.5–15.5)
WBC: 11.9 10*3/uL — ABNORMAL HIGH (ref 4.0–10.5)
nRBC: 0 % (ref 0.0–0.2)

## 2021-10-11 LAB — TROPONIN I (HIGH SENSITIVITY): Troponin I (High Sensitivity): 6 ng/L (ref <18)

## 2021-10-11 LAB — LIPASE, BLOOD: Lipase: 26 U/L (ref 11–51)

## 2021-10-11 MED ORDER — LIDOCAINE VISCOUS HCL 2 % MT SOLN
15.0000 mL | Freq: Once | OROMUCOSAL | Status: AC
  Administered 2021-10-11: 15 mL via ORAL
  Filled 2021-10-11: qty 15

## 2021-10-11 MED ORDER — ALUM & MAG HYDROXIDE-SIMETH 200-200-20 MG/5ML PO SUSP
30.0000 mL | Freq: Once | ORAL | Status: AC
  Administered 2021-10-11: 30 mL via ORAL
  Filled 2021-10-11: qty 30

## 2021-10-11 MED ORDER — IOHEXOL 300 MG/ML  SOLN
100.0000 mL | Freq: Once | INTRAMUSCULAR | Status: AC | PRN
  Administered 2021-10-11: 100 mL via INTRAVENOUS

## 2021-10-11 MED ORDER — HYDROMORPHONE HCL 1 MG/ML IJ SOLN
1.0000 mg | Freq: Once | INTRAMUSCULAR | Status: AC
  Administered 2021-10-11: 1 mg via INTRAVENOUS
  Filled 2021-10-11: qty 1

## 2021-10-11 NOTE — ED Provider Notes (Signed)
[facility] EMERGENCY DEPARTMENT Provider Note   CSN: [id number] Arrival date & time: 10/11/21  1922     History {Add pertinent medical, surgical, social history, OB history to HPI:1} Chief Complaint  Patient presents with   Abdominal Pain    Arick Selwyn Reason. is a 56 y.o. male with a history of borderline diabetes, hypertension, presenting to ED with epigastric discomfort and nausea.  The patient reports that his symptoms began around 1 PM while he was outside mowing the lawn in the heat today.  He was on a ride lawnmower.  The symptoms abated and he rested, and then subsequently they returned at 4 PM.  He felt nauseated, has had epigastric discomfort that feels like "heartburn".  It does not radiate anywhere.  His wife reports he was diaphoretic with this.  He went to urgent care and was advised to come to the ED for further evaluation.  He continues to have 8 out of 10 epigastric discomfort.  He denies any known personal or significant family history of MI or coronary disease.  He has never had a stress test or coronary testing.  He denies history of smoking, or high cholesterol.  He does take blood pressure medication daily.  He is also on metformin for "borderline diabetes".  He denies history of angina preceding this.  He does suffer from reflux but this felt different today.  He reports his only abdominal surgery was an appendectomy.  HPI     Home Medications Prior to Admission medications   Medication Sig Start Date End Date Taking? Authorizing Provider  losartan-hydrochlorothiazide (HYZAAR) 100-25 MG tablet TAKE 1 TABLET BY MOUTH DAILY 10/10/21   Margaree Mackintosh, MD  metFORMIN (GLUCOPHAGE) 500 MG tablet Take 2 tablets (1,000 mg total) by mouth 2 (two) times daily with a meal. 10/10/21   Baxley, Luanna Cole, MD  omeprazole (PRILOSEC) 10 MG capsule Take 10 mg by mouth daily.    [provider]  ONGLYZA 5 MG TABS tablet TAKE 1 TABLET(5 MG) BY MOUTH DAILY  06/02/21   Margaree Mackintosh, MD      Allergies    Penicillins    Review of Systems   Review of Systems  Physical Exam Updated Vital Signs BP 139/85   Pulse 86   Temp 99 F (37.2 C) (Oral)   Resp 14   SpO2 99%  Physical Exam Constitutional:      General: He is not in acute distress. HENT:     Head: Normocephalic and atraumatic.  Eyes:     Conjunctiva/sclera: Conjunctivae normal.     Pupils: Pupils are equal, round, and reactive to light.  Cardiovascular:     Rate and Rhythm: Normal rate and regular rhythm.  Pulmonary:     Effort: Pulmonary effort is normal. No respiratory distress.  Abdominal:     General: There is no distension.     Tenderness: There is abdominal tenderness in the epigastric area. There is no guarding. Negative signs include Murphy's sign.  Skin:    General: Skin is warm and dry.  Neurological:     General: No focal deficit present.     Mental Status: He is alert. Mental status is at baseline.  Psychiatric:        Mood and Affect: Mood normal.        Behavior: Behavior normal.    ED Results / Procedures / Treatments   Labs (all labs ordered are listed, but only abnormal results are displayed)  Labs Reviewed  CBC - Abnormal; Notable for the following components:      Result Value   WBC 11.9 (*)    All other components within normal limits  COMPREHENSIVE METABOLIC PANEL - Abnormal; Notable for the following components:   Glucose, Bld 180 (*)    Alkaline Phosphatase 35 (*)    All other components within normal limits  LIPASE, BLOOD  URINALYSIS, ROUTINE W REFLEX MICROSCOPIC  TROPONIN I (HIGH SENSITIVITY)  TROPONIN I (HIGH SENSITIVITY)    EKG EKG Interpretation  Date/Time:  Friday October 11 2021 19:49:56 EDT Ventricular Rate:  69 PR Interval:  156 QRS Duration: 97 QT Interval:  390 QTC Calculation: 418 R Axis:   160 Text Interpretation: Sinus rhythm Right axis deviation Low voltage, precordial leads Confirmed by Alvester Chou [phone]) on  10/11/2021 8:39:26 PM  Radiology DG Chest Portable 1 View  Result Date: 10/11/2021 CLINICAL DATA:  Epigastric pain EXAM: PORTABLE CHEST 1 VIEW COMPARISON:  None Available. FINDINGS: The heart size and mediastinal contours are within normal limits. Both lungs are clear. The visualized skeletal structures are unremarkable. IMPRESSION: No active disease. Electronically Signed   By: Ernie Avena M.D.   On: 10/11/2021 20:51    Procedures Procedures  {Document cardiac monitor, telemetry assessment procedure when appropriate:1}  Medications Ordered in ED Medications  alum & mag hydroxide-simeth (MAALOX/MYLANTA) 200-200-20 MG/5ML suspension 30 mL (has no administration in time range)    And  lidocaine (XYLOCAINE) 2 % viscous mouth solution 15 mL (has no administration in time range)  HYDROmorphone (DILAUDID) injection 1 mg (1 mg Intravenous Given 10/11/21 2211)    ED Course/ Medical Decision Making/ A&P                           Medical Decision Making Amount and/or Complexity of Data Reviewed Labs: ordered. Radiology: ordered.  Risk OTC drugs. Prescription drug management.   This patient presents to the Emergency Department with complaint of chest pain. This involves an extensive number of treatment options, and is a complaint that carries with it a high risk of complications and morbidity, given the patient's comorbidity, including prediabetes, hypertension.The differential diagnosis includes ACS vs Pneumothorax vs Reflux/Gastritis vs MSK pain vs Pneumonia vs other.  This may also be referred pain from gastritis, peptic ulcer disease, right upper quadrant biliary disease  I felt PE was less likely given that his symptoms are intermittent, is not hypoxic, no acute risk factors for Pe  I ordered, reviewed, and interpreted labs.  Pertinent results include CMP largely unremarkable, lipase within normal limits, initial troponin 6, CBC with very mild leukocytosis. I ordered medication IV  Dilaudid, GI cocktail for chest pain I ordered imaging studies which included x-ray of the chest, CT abdomen pelvis I independently visualized and interpreted imaging which showed no focal infiltrate or pneumothorax of the chest, *** and the monitor tracing which showed normal sinus rhythm. I agree with the radiologist interpretation Additional history was obtained from the patient's wife at bedside I personally reviewed the patients ECG which showed sinus rhythm with no acute ischemic findings  After the interventions stated above, I reevaluated the patient and found that they were ***  Based on the patient's clinical exam, vital signs, risk factors, and ED testing, I felt that the patient's overall risk of life-threatening emergency such as ACS, PE, sepsis, or infection was low.  At this time, I felt the patient's presentation was most clinically consistent with ***,  but explained to the patient that this evaluation was not a definitive diagnostic workup.  I discussed outpatient follow up with primary care provider, and provided specialist office number on the patient's discharge paper if a referral was deemed necessary.  Return precautions were discussed with the patient.  I felt the patient was clinically stable for discharge.   {Document critical care time when appropriate:1} {Document review of labs and clinical decision tools ie heart score, Chads2Vasc2 etc:1}  {Document your independent review of radiology images, and any outside records:1} {Document your discussion with family members, caretakers, and with consultants:1} {Document social determinants of health affecting pt's care:1} {Document your decision making why or why not admission, treatments were needed:1} Final Clinical Impression(s) / ED Diagnoses Final diagnoses:  None    Rx / DC Orders ED Discharge Orders     None

## 2021-10-11 NOTE — ED Triage Notes (Signed)
Pt BIB [facility] from [facility] because of concerns for cardiac involvement in epigastric pain. Patient reports 8/10 pain starting at 4pm today after mowing. No resolution with nitro and aspirin administration.

## 2021-10-12 NOTE — Discharge Instructions (Signed)
We talked about differential of your symptoms which would include possible gallstone pain versus cardiac or heart conditions.  I placed a referral to the heart care group to help establish a follow-up visit for you to have a cardiac work-up.  You should hear from their office within 3 business days.  If you do not hear from them please call the number above to make an appointment.  Separately, you will need to call the phone number above for [facility] to establish an appointment for your gallstones.  It is possible that your abdominal pain is coming from gallstone problems.  Please read over the information included about gallstones.  You can take Tylenol and ibuprofen as needed at home for pain.  However, if you do have worsening pain, or any new symptoms including chest pressure, difficulty breathing, lightheadedness, persistent vomiting, or any other life-threatening symptoms, please call 911 and come back to the ER immediately.  As we discussed, a normal work-up in the ER does not rule out all serious disease, including heart disease.  You do need outpatient follow-up.

## 2021-10-12 NOTE — ED Notes (Signed)
Pt verbalized understanding of d/c instructions, meds, and followup care. Denies questions. VSS, no distress noted. Steady gait to exit with all belongings.  ?

## 2021-10-15 ENCOUNTER — Other Ambulatory Visit: Payer: 59

## 2021-10-15 ENCOUNTER — Telehealth: Payer: Self-pay | Admitting: Internal Medicine

## 2021-10-15 DIAGNOSIS — E119 Type 2 diabetes mellitus without complications: Secondary | ICD-10-CM

## 2021-10-15 DIAGNOSIS — E7800 Pure hypercholesterolemia, unspecified: Secondary | ICD-10-CM

## 2021-10-15 NOTE — Telephone Encounter (Signed)
Dominic Garner [phone]  Elliot Gurney called to ask if the gallstones would make you sore and feel tired and run down.

## 2021-10-15 NOTE — Telephone Encounter (Signed)
Called Woody back, he verbalized understanding

## 2021-10-16 ENCOUNTER — Encounter: Payer: Self-pay | Admitting: Cardiovascular Disease

## 2021-10-16 ENCOUNTER — Ambulatory Visit (INDEPENDENT_AMBULATORY_CARE_PROVIDER_SITE_OTHER): Payer: 59 | Admitting: Cardiovascular Disease

## 2021-10-16 VITALS — BP 138/82 | HR 94 | Ht 72.0 in | Wt 248.8 lb

## 2021-10-16 DIAGNOSIS — K8020 Calculus of gallbladder without cholecystitis without obstruction: Secondary | ICD-10-CM | POA: Diagnosis not present

## 2021-10-16 DIAGNOSIS — I10 Essential (primary) hypertension: Secondary | ICD-10-CM | POA: Diagnosis not present

## 2021-10-16 DIAGNOSIS — E7800 Pure hypercholesterolemia, unspecified: Secondary | ICD-10-CM | POA: Diagnosis not present

## 2021-10-16 DIAGNOSIS — E8881 Metabolic syndrome: Secondary | ICD-10-CM

## 2021-10-16 DIAGNOSIS — R7303 Prediabetes: Secondary | ICD-10-CM | POA: Diagnosis not present

## 2021-10-16 DIAGNOSIS — R7302 Impaired glucose tolerance (oral): Secondary | ICD-10-CM

## 2021-10-16 DIAGNOSIS — K760 Fatty (change of) liver, not elsewhere classified: Secondary | ICD-10-CM

## 2021-10-16 DIAGNOSIS — I4510 Unspecified right bundle-branch block: Secondary | ICD-10-CM

## 2021-10-16 DIAGNOSIS — R9431 Abnormal electrocardiogram [ECG] [EKG]: Secondary | ICD-10-CM

## 2021-10-16 LAB — LIPID PANEL
Cholesterol: 202 mg/dL — ABNORMAL HIGH (ref <200)
HDL: 64 mg/dL (ref >=40)
LDL Cholesterol (Calc): 122 mg/dL (calc) — ABNORMAL HIGH
Non-HDL Cholesterol (Calc): 138 mg/dL (calc) — ABNORMAL HIGH (ref <130)
Total CHOL/HDL Ratio: 3.2 (calc) (ref <5.0)
Triglycerides: 72 mg/dL (ref <150)

## 2021-10-16 LAB — HEMOGLOBIN A1C
Hgb A1c MFr Bld: 6.4 % of total Hgb — ABNORMAL HIGH (ref <5.7)
Mean Plasma Glucose: 137 mg/dL
eAG (mmol/L): 7.6 mmol/L

## 2021-10-16 NOTE — Patient Instructions (Addendum)
Medication Instructions:   Not needed *If you need a refill on your cardiac medications before your next appointment, please call your pharmacy*   Lab Work: Not needed    Testing/Procedures: Your physician has requested that you have an echocardiogram. Echocardiography is a painless test that uses sound waves to create images of your heart. It provides your doctor with information about the size and shape of your heart and how well your heart's chambers and valves are working. This procedure takes approximately one hour. There are no restrictions for this procedure.   And   CT coronary calcium score.   Test locations:  [facility] [place], [place] [address])   This is $99 out of pocket.   Coronary CalciumScan A coronary calcium scan is an imaging test used to look for deposits of calcium and other fatty materials (plaques) in the inner lining of the blood vessels of the heart (coronary arteries). These deposits of calcium and plaques can partly clog and narrow the coronary arteries without producing any symptoms or warning signs. This puts a person at risk for a heart attack. This test can detect these deposits before symptoms develop. Tell a health care provider about: Any allergies you have. All medicines you are taking, including vitamins, herbs, eye drops, creams, and over-the-counter medicines. Any problems you or family members have had with anesthetic medicines. Any blood disorders you have. Any surgeries you have had. Any medical conditions you have. Whether you are pregnant or may be pregnant. What are the risks? Generally, this is a safe procedure. However, problems may occur, including: Harm to a pregnant woman and her unborn baby. This test involves the use of radiation. Radiation exposure can be dangerous to a pregnant woman and her unborn baby. If you are pregnant, you generally should not have this procedure done. Slight increase in the  risk of cancer. This is because of the radiation involved in the test. What happens before the procedure? No preparation is needed for this procedure. What happens during the procedure? You will undress and remove any jewelry around your neck or chest. You will put on a hospital gown. Sticky electrodes will be placed on your chest. The electrodes will be connected to an electrocardiogram (ECG) machine to record a tracing of the electrical activity of your heart. A CT scanner will take pictures of your heart. During this time, you will be asked to lie still and hold your breath for 2-3 seconds while a picture of your heart is being taken. The procedure may vary among health care providers and hospitals. What happens after the procedure? You can get dressed. You can return to your normal activities. It is up to you to get the results of your test. Ask your health care provider, or the department that is doing the test, when your results will be ready. Summary A coronary calcium scan is an imaging test used to look for deposits of calcium and other fatty materials (plaques) in the inner lining of the blood vessels of the heart (coronary arteries). Generally, this is a safe procedure. Tell your health care provider if you are pregnant or may be pregnant. No preparation is needed for this procedure. A CT scanner will take pictures of your heart. You can return to your normal activities after the scan is done. This information is not intended to replace advice given to you by your health care provider. Make sure you discuss any questions you have with your health care provider.  Document Released: 10/25/2007 Document Revised: 03/17/2016 Document Reviewed: 03/17/2016 Elsevier Interactive Patient Education  2017 [employer].    Follow-Up: At [facility], you and your health needs are our priority.  As part of our continuing mission to provide you with exceptional heart care, we have created  designated Provider Care Teams.  These Care Teams include your primary Cardiologist (physician) and Advanced Practice Providers (APPs -  Physician Assistants and Nurse Practitioners) who all work together to provide you with the care you need, when you need it.     Your next appointment:   2 month(s)  The format for your next appointment:   In Person  Provider:   Nicki Guadalajara, MD    Other Instructions

## 2021-10-16 NOTE — Progress Notes (Unsigned)
Cardiology Office Note    Date:  10/16/2021   ID:  Dominic Gully., DOB 06-15-1965, MRN [id number]  PCP:  Elby Showers, MD  Cardiologist:  Shelva Majestic, MD   New consult referred through   History of Present Illness:  Dominic Pease. is a 56 y.o. male ***    Past Medical History:  Diagnosis Date   ADD (attention deficit disorder)    Hypertension     Past Surgical History:  Procedure Laterality Date   APPENDECTOMY      Current Medications: Outpatient Medications Prior to Visit  Medication Sig Dispense Refill   losartan-hydrochlorothiazide (HYZAAR) 100-25 MG tablet TAKE 1 TABLET BY MOUTH DAILY 90 tablet 1   metFORMIN (GLUCOPHAGE) 500 MG tablet Take 2 tablets (1,000 mg total) by mouth 2 (two) times daily with a meal. 360 tablet 1   omeprazole (PRILOSEC) 10 MG capsule Take 10 mg by mouth daily.     ONGLYZA 5 MG TABS tablet TAKE 1 TABLET(5 MG) BY MOUTH DAILY 90 tablet 1   No facility-administered medications prior to visit.     Allergies:   Penicillins   Social History   Socioeconomic History   Marital status: Single    Spouse name: Not on file   Number of children: Not on file   Years of education: Not on file   Highest education level: Not on file  Occupational History   Not on file  Tobacco Use   Smoking status: Never   Smokeless tobacco: Never  Substance and Sexual Activity   Alcohol use: Yes    Comment: 4-5 wine a week   Drug use: No   Sexual activity: Not on file  Other Topics Concern   Not on file  Social History Narrative   Not on file   Social Determinants of Health   Financial Resource Strain: Not on file  Food Insecurity: Not on file  Transportation Needs: Not on file  Physical Activity: Not on file  Stress: Not on file  Social Connections: Not on file     Family History:  The patient's ***family history includes Urolithiasis in his father.   ROS General: Negative; No fevers, chills, or night sweats;  HEENT:  Negative; No changes in vision or hearing, sinus congestion, difficulty swallowing Pulmonary: Negative; No cough, wheezing, shortness of breath, hemoptysis Cardiovascular: Negative; No chest pain, presyncope, syncope, palpitations GI: Negative; No nausea, vomiting, diarrhea, or abdominal pain GU: Negative; No dysuria, hematuria, or difficulty voiding Musculoskeletal: Negative; no myalgias, joint pain, or weakness Hematologic/Oncology: Negative; no easy bruising, bleeding Endocrine: Negative; no heat/cold intolerance; no diabetes Neuro: Negative; no changes in balance, headaches Skin: Negative; No rashes or skin lesions Psychiatric: Negative; No behavioral problems, depression Sleep: Negative; No snoring, daytime sleepiness, hypersomnolence, bruxism, restless legs, hypnogognic hallucinations, no cataplexy Other comprehensive 14 point system review is negative.   PHYSICAL EXAM:   VS:  BP 138/82 (BP Location: Left Arm)   Pulse 94   Ht 6' (1.829 m)   Wt 248 lb 12.8 oz (112.9 kg)   SpO2 96%   BMI 33.74 kg/m    Wt Readings from Last 3 Encounters:  10/16/21 248 lb 12.8 oz (112.9 kg)  04/26/21 245 lb (111.1 kg)  11/16/20 242 lb (109.8 kg)    General: Alert, oriented, no distress.  Skin: normal turgor, no rashes, warm and dry HEENT: Normocephalic, atraumatic. Pupils equal round and reactive to light; sclera anicteric; extraocular muscles intact; Fundi ** Nose without nasal  septal hypertrophy Mouth/Parynx benign; Mallinpatti scale Neck: No JVD, no carotid bruits; normal carotid upstroke Lungs: clear to ausculatation and percussion; no wheezing or rales Chest wall: without tenderness to palpitation Heart: PMI not displaced, RRR, s1 s2 normal, 1/6 systolic murmur, no diastolic murmur, no rubs, gallops, thrills, or heaves Abdomen: soft, nontender; no hepatosplenomehaly, BS+; abdominal aorta nontender and not dilated by palpation. Back: no CVA tenderness Pulses 2+ Musculoskeletal: full  range of motion, normal strength, no joint deformities Extremities: no clubbing cyanosis or edema, Homan's sign negative  Neurologic: grossly nonfocal; Cranial nerves grossly wnl Psychologic: Normal mood and affect   Studies/Labs Reviewed:   October 16, 2021 ECG (independently read by me): NSR at 92; right superior axis, IRBBB  Recent Labs:    Latest Ref Rng & Units 10/11/2021    9:36 PM 04/25/2021    9:16 AM 07/03/2020   10:04 AM  BMP  Glucose 70 - 99 mg/dL 180   158   194    BUN 6 - 20 mg/dL 16   15   16     Creatinine 0.61 - 1.24 mg/dL 1.00   0.81   0.92    BUN/Creat Ratio 6 - 22 (calc)  NOT APPLICABLE   NOT APPLICABLE    Sodium 381 - 145 mmol/L 137   138   137    Potassium 3.5 - 5.1 mmol/L 3.7   4.0   4.4    Chloride 98 - 111 mmol/L 100   99   99    CO2 22 - 32 mmol/L 27   30   27     Calcium 8.9 - 10.3 mg/dL 10.1   9.7   9.7          Latest Ref Rng & Units 10/11/2021    9:36 PM 04/25/2021    9:16 AM 12/30/2019    9:31 AM  Hepatic Function  Total Protein 6.5 - 8.1 g/dL 7.6   7.6   7.5    Albumin 3.5 - 5.0 g/dL 4.4      AST 15 - 41 U/L 27   22   24     ALT 0 - 44 U/L 32   22   36    Alk Phosphatase 38 - 126 U/L 35      Total Bilirubin 0.3 - 1.2 mg/dL 1.2   1.0   0.9         Latest Ref Rng & Units 10/11/2021    8:39 PM 04/25/2021    9:16 AM 03/20/2020    4:59 PM  CBC  WBC 4.0 - 10.5 K/uL 11.9   5.4   13.2    Hemoglobin 13.0 - 17.0 g/dL 15.8   15.3   14.7    Hematocrit 39.0 - 52.0 % 44.5   45.1   42.5    Platelets 150 - 400 K/uL 266   296   317     Lab Results  Component Value Date   MCV 88.3 10/11/2021   MCV 89.7 04/25/2021   MCV 90.2 03/20/2020   Lab Results  Component Value Date   TSH 1.61 07/24/2015   Lab Results  Component Value Date   HGBA1C 6.4 (H) 10/15/2021     BNP No results found for: BNP  ProBNP No results found for: PROBNP   Lipid Panel     Component Value Date/Time   CHOL 202 (H) 10/15/2021 0920   TRIG 72 10/15/2021 0920   HDL 64  10/15/2021 0920  CHOLHDL 3.2 10/15/2021 0920   VLDL 17 08/11/2016 0950   LDLCALC 122 (H) 10/15/2021 0920     RADIOLOGY: CT ABDOMEN PELVIS W CONTRAST  Result Date: 10/11/2021 CLINICAL DATA:  Epigastric pain EXAM: CT ABDOMEN AND PELVIS WITH CONTRAST TECHNIQUE: Multidetector CT imaging of the abdomen and pelvis was performed using the standard protocol following bolus administration of intravenous contrast. RADIATION DOSE REDUCTION: This exam was performed according to the departmental dose-optimization program which includes automated exposure control, adjustment of the mA and/or kV according to patient size and/or use of iterative reconstruction technique. CONTRAST:  12m OMNIPAQUE IOHEXOL 300 MG/ML  SOLN COMPARISON:  Chest x-ray 10/11/2021 FINDINGS: Lower chest: Lung bases demonstrate no acute consolidation or effusion. Hepatobiliary: Multiple gallstones. Hepatic steatosis. No biliary dilatation. Pancreas: Unremarkable. No pancreatic ductal dilatation or surrounding inflammatory changes. Spleen: Normal in size without focal abnormality. Adrenals/Urinary Tract: Adrenal glands are unremarkable. Kidneys are normal, without renal calculi, focal lesion, or hydronephrosis. Bladder is unremarkable. Stomach/Bowel: Stomach is within normal limits. History of appendectomy. Diverticular disease of the left colon without acute wall thickening. No evidence of bowel wall thickening, distention, or inflammatory changes. Vascular/Lymphatic: Nonaneurysmal aorta.  No suspicious lymph nodes Reproductive: Prostate is unremarkable. Other: Negative for pelvic effusion or free air. Small fat containing left inguinal hernia Musculoskeletal: No acute or significant osseous findings. IMPRESSION: 1. No CT evidence for acute intra-abdominal or pelvic abnormality. 2. Gallstones without right upper quadrant inflammatory process 3. Hepatic steatosis 4. Diverticular disease of the left colon without acute inflammatory process.  Electronically Signed   By: KDonavan FoilM.D.   On: 10/11/2021 23:59   DG Chest Portable 1 View  Result Date: 10/11/2021 CLINICAL DATA:  Epigastric pain EXAM: PORTABLE CHEST 1 VIEW COMPARISON:  None Available. FINDINGS: The heart size and mediastinal contours are within normal limits. Both lungs are clear. The visualized skeletal structures are unremarkable. IMPRESSION: No active disease. Electronically Signed   By: PElmer PickerM.D.   On: 10/11/2021 20:51     Additional studies/ records that were reviewed today include:  ***    ASSESSMENT:    No diagnosis found.   PLAN:  ***   Medication Adjustments/Labs and Tests Ordered: Current medicines are reviewed at length with the patient today.  Concerns regarding medicines are outlined above.  Medication changes, Labs and Tests ordered today are listed in the Patient Instructions below. There are no Patient Instructions on file for this visit.   Signed, TShelva Majestic MD  10/16/2021 2:02 PM    [facility] [address] [place]  [id number]: [phone]

## 2021-10-17 ENCOUNTER — Encounter: Payer: Self-pay | Admitting: Internal Medicine

## 2021-10-17 ENCOUNTER — Ambulatory Visit (INDEPENDENT_AMBULATORY_CARE_PROVIDER_SITE_OTHER): Payer: 59 | Admitting: Internal Medicine

## 2021-10-17 VITALS — BP 130/90 | HR 90 | Temp 97.4°F | Ht 72.0 in | Wt 242.2 lb

## 2021-10-17 DIAGNOSIS — S30860A Insect bite (nonvenomous) of lower back and pelvis, initial encounter: Secondary | ICD-10-CM

## 2021-10-17 DIAGNOSIS — W57XXXA Bitten or stung by nonvenomous insect and other nonvenomous arthropods, initial encounter: Secondary | ICD-10-CM | POA: Diagnosis not present

## 2021-10-17 DIAGNOSIS — R1011 Right upper quadrant pain: Secondary | ICD-10-CM

## 2021-10-17 MED ORDER — DOXYCYCLINE HYCLATE 100 MG PO TABS: 100.0000 mg | ORAL_TABLET | Freq: Two times a day (BID) | ORAL | 0 refills | Status: AC

## 2021-10-17 NOTE — Progress Notes (Signed)
Subjective:    Patient ID: Dominic Gully., male    DOB: 1966-05-05, 56 y.o.   MRN: [id number]  HPI 56 year old Male had onset with heartburn and right upper quadrant pain on June 2. Went to Urgent Care for evaluation and was transferred to [facility] emergency department.  Patient was mowing his lawn in the heat and symptoms began around 1 PM on June 2.  He was on a riding lawnmower.  Patient says his symptoms returned around 4 PM, he felt nauseated and had epigastric discomfort that felt like heartburn.  Wife reported he was diaphoretic.  He had CT of the abdomen and pelvis and was found to have multiple gallstones and hepatic steatosis.  Also noted to have diverticular disease of the left colon without acute inflammatory process.  White blood cell count was 11,900.  Hemoglobin and platelet count are both normal.  Liver functions were normal with the exception of alkaline phosphatase low at 35.  Lipase was normal.  Urine contained ketones with specific gravity 1.024.  No nitrite, LAD or red blood cells in urine.  Troponin I was 6.  ED physician felt patient was clinically stable to be discharged and recommended cardiology evaluation.  ED physician felt patient had gallstones but not necessarily acute cholecystitis.  It was felt he could possibly have biliary colic.  Since being discharged home he has been okay with no further recurrence of abdominal pain.  Hemoglobin A1c drawn on June 6 is 6.4% and was 6.3% in December 2022.  Lipid panel drawn on same day showed LDL of 122 and total cholesterol of 202 with HDL of 64 and triglycerides of 72.  He has no previous history of MI coronary disease.  He has never had a stress test.  No family history of coronary disease.  He does not smoke.  Social alcohol consumption.  Social history: He is married.  No children.  He is married to the former Dominic Garner and they are both employed with [employer]  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  His wife works as Dominic Garner, [employer].  Family history: Father with history of kidney stones and prostate cancer.  Mother with history of tremor requiring electrode implantation in the brain and was treated with Sinemet but was told she did not have Parkinson's disease.  She passed away in [id number] with complications of septicemia.  He has 2 sisters in good Garner.  Review of systems: History of low back pain in 2021 with yard work and had prior episode in 2019.  He had visit with Dominic Garner, Cardiologist yesterday and EKG showed normal sinus rhythm, right axis deviation, RVH and incomplete right bundle branch block.  He is to have CT coronary calcium score in the near future with further instructions to follow.  He is also having a 2D echocardiogram in the near future.  He has a history of GE reflux treated with over-the-counter PPI, history of type 2 diabetes mellitus, and essential hypertension.   History of obesity.  Current weight is 242 pounds 4 ounces and BMI is 32.86.  Previously weighed 247 pounds with BMI 34.94 in February 2022.  Had tick bite some 10 days ago.Will treat with oral  doxycycline x 7days. He has no symptoms currently but has other issues at present to evaluate.  Lipids need some work. He is not exercising. Needs to watch diet. LDL is 122.  Hgb 6.4%-Taking onglyza 5  mg and metformin  1500mg  daily and not 2000 mg as prescribed.  We last saw him in December 2022 and his hemoglobin A1c was 6.3%.  He is allergic to penicillin-it causes a rash.  He had an appendectomy in the 1980s.  He is past due for colonoscopy.  Last colonoscopy was in 2011 by Dr. Penne Lash.        Review of Systems Has tick bite on buttock     Objective:   Physical Exam Blood pressure 130/90 pulse 90 temperature 97.4 degrees with ear thermometer pulse oximetry 96% weight 242 pounds 4 ounces BMI 32.86 Skin: Warm and dry.  No cervical adenopathy.  No  thyromegaly.  No carotid bruits.  Chest is clear to auscultation.  Cardiac exam: Regular rate and rhythm without ectopy or murmurs.  Abdomen is soft obese, nondistended without hepatosplenomegaly masses or tenderness at the present time.      Assessment & Plan:  Episodes of epigastric and right upper quadrant pain on June 2 evaluated in emergency department.  Found to have multiple gallstones.  MI was ruled out.  Recent tick bite-not sure exactly when he was bitten by tick but will treat with doxycycline for 7 days.  He does not have symptoms of RMSF  Type 2 diabetes mellitus  Essential hypertension-stable  BMI 32.86  History of GE reflux treated with low-dose Prilosec  Plan: He will have surgical evaluation in the near future.  I think he would be a good candidate for Garner diabetic medications such as Wegovy or Rybelsus but prefer that he have cardiology evaluation completed and surgical evaluation before making that change.  Plan: Patient is agreeable to surgical evaluation for gallstones.  Continue current medications and take doxycycline for 7 days for tick bite.

## 2021-11-02 ENCOUNTER — Encounter: Payer: Self-pay | Admitting: Cardiovascular Disease

## 2021-11-23 ENCOUNTER — Other Ambulatory Visit: Payer: Self-pay | Admitting: Internal Medicine

## 2021-11-27 ENCOUNTER — Ambulatory Visit (INDEPENDENT_AMBULATORY_CARE_PROVIDER_SITE_OTHER): Payer: 59

## 2021-11-27 ENCOUNTER — Encounter (HOSPITAL_BASED_OUTPATIENT_CLINIC_OR_DEPARTMENT_OTHER): Payer: Self-pay

## 2021-11-27 ENCOUNTER — Ambulatory Visit (HOSPITAL_BASED_OUTPATIENT_CLINIC_OR_DEPARTMENT_OTHER)
Admission: RE | Admit: 2021-11-27 | Discharge: 2021-11-27 | Disposition: A | Discharge status: Home / Self Care | Payer: 59 | Source: Ambulatory Visit | Attending: Cardiovascular Disease | Admitting: Cardiovascular Disease

## 2021-11-27 DIAGNOSIS — I4510 Unspecified right bundle-branch block: Secondary | ICD-10-CM | POA: Diagnosis not present

## 2021-11-27 DIAGNOSIS — I10 Essential (primary) hypertension: Secondary | ICD-10-CM | POA: Diagnosis not present

## 2021-11-27 DIAGNOSIS — R7303 Prediabetes: Secondary | ICD-10-CM

## 2021-11-27 LAB — ECHOCARDIOGRAM COMPLETE
AR max vel: 4.04 cm2
AV Area VTI: 4.07 cm2
AV Area mean vel: 4.07 cm2
AV Mean grad: 2 mmHg
AV Peak grad: 3.1 mmHg
Ao pk vel: 0.88 m/s
Area-P 1/2: 3.85 cm2
S' Lateral: 2.38 cm

## 2022-01-01 ENCOUNTER — Encounter: Payer: Self-pay | Admitting: Cardiovascular Disease

## 2022-01-01 ENCOUNTER — Ambulatory Visit (INDEPENDENT_AMBULATORY_CARE_PROVIDER_SITE_OTHER): Payer: 59 | Admitting: Cardiovascular Disease

## 2022-01-01 VITALS — BP 138/82 | HR 96 | Ht 72.0 in | Wt 258.8 lb

## 2022-01-01 DIAGNOSIS — K760 Fatty (change of) liver, not elsewhere classified: Secondary | ICD-10-CM

## 2022-01-01 DIAGNOSIS — K8020 Calculus of gallbladder without cholecystitis without obstruction: Secondary | ICD-10-CM

## 2022-01-01 DIAGNOSIS — I10 Essential (primary) hypertension: Secondary | ICD-10-CM | POA: Diagnosis not present

## 2022-01-01 DIAGNOSIS — E7800 Pure hypercholesterolemia, unspecified: Secondary | ICD-10-CM | POA: Diagnosis not present

## 2022-01-01 NOTE — Patient Instructions (Signed)

## 2022-01-01 NOTE — Progress Notes (Signed)
Cardiology Office Note    Date:  01/01/2022   ID:  Dominic Garner., DOB 08/02/1965, MRN [id number]  PCP:  Elby Showers, MD  Cardiologist:  Shelva Majestic, MD    2 month referred through [facility] following his recent October 11, 2021 presentation.   History of Present Illness:  Dominic Dusza. is a 56 y.o. male who is followed by Dominic Garner for primary care.  He has a history of hypertension for approximately 7 years and impaired glucose tolerance and mild obesity.  He last saw Dr. Renold Genta in December 2022.  He was on losartan HCT 100/25 mg daily for hyper tension.  He was taking omeprazole 10 mg daily for GE reflux.  With his diabetes he was on metformin in addition to [place].  Laboratory from April 25, 2021 showed total cholesterol 213, HDL 54, LDL 142.  Non-HDL was 159.  On October 11, 2020, he began to develop epigastric discomfort and nausea.  His symptoms began around 1 PM while he was outside cutting his grass in the heat  He went to urgent care and was advised that he go to [facility] ER for further evaluation due to continued epigastric discomfort.  He underwent CT of his abdomen and pelvis which showed multiple gallstones and hepatic steatosis without biliary dilatation.  There was diverticular disease of the left colon without acute inflammatory process.  He underwent subsequent laboratory on June 6 by Dr. Renold Genta which showed a total cholesterol 202 HDL 64 LDL 122.  Hemoglobin A1c was 6.4.  He now presents for cardiology evaluation.  When I saw him on October 16, 2021 he denied any chest pain or shortness of breath.  He walks regularly and cuts his lawn weekly.  He uses a Chiropractor.  He admits that he sleeps fairly well but he snores when he is on his back and has nocturia approximately 1 time per night.  During that evaluation, with his history of hypertension I recommended he undergo a 2D echo Doppler study for assessment of LV systolic and diastolic function as well as  valvular architecture.  His blood pressure was stable on losartan HCT 100/25 mg.  Light of his cardiovascular risks including hypertension, mild hyperlipidemia, diabetes I recommended baseline coronary calcium score for further assessment.    He underwent a 2D echo Doppler study on November 27, 2021.  This revealed normal LV function with EF 55% with normal diastolic parameters.  There was no significant valvular abnormalities.  His coronary calcium score was excellent at 0.  Of note the main pulmonary artery measures 3.3 cm.  The read of his CT of his chest did not show any acute findings in the chest.  He again was found to have moderate hepatic steatosis and there was evidence for mild aortic atherosclerosis.  Presently , he feels well.  He continues to be on losartan HCT 100/25 mg for blood pressure control.  He admits that he is under increased stress.  He works at [employer] which also is a family run business on his wife's side but at times adds to additional stress.  He tells me since I saw him he was treated for possible Lyme disease with antibiotics in June and improved rapidly.  Continues to be on metformin and analyze a for diabetes and omeprazole for GERD.  He presents for reevaluation.   Past Medical History:  Diagnosis Date   ADD (attention deficit disorder)    Hypertension  Past Surgical History:  Procedure Laterality Date   APPENDECTOMY      Current Medications: Outpatient Medications Prior to Visit  Medication Sig Dispense Refill   losartan-hydrochlorothiazide (HYZAAR) 100-25 MG tablet TAKE 1 TABLET BY MOUTH DAILY 90 tablet 1   metFORMIN (GLUCOPHAGE) 500 MG tablet Take 2 tablets (1,000 mg total) by mouth 2 (two) times daily with a meal. 360 tablet 1   omeprazole (PRILOSEC) 10 MG capsule Take 10 mg by mouth daily.     ONGLYZA 5 MG TABS tablet TAKE 1 TABLET(5 MG) BY MOUTH DAILY 90 tablet 1   No facility-administered medications prior to visit.     Allergies:    Penicillins   Social History   Socioeconomic History   Marital status: Single    Spouse name: Not on file   Number of children: Not on file   Years of education: Not on file   Highest education level: Not on file  Occupational History   Not on file  Tobacco Use   Smoking status: Never   Smokeless tobacco: Never  Substance and Sexual Activity   Alcohol use: Yes    Comment: 4-5 wine a week   Drug use: No   Sexual activity: Not on file  Other Topics Concern   Not on file  Social History Narrative   Not on file   Social Determinants of Health   Financial Resource Strain: Not on file  Food Insecurity: Not on file  Transportation Needs: Not on file  Physical Activity: Not on file  Stress: Not on file  Social Connections: Not on file    Socially he is married for 25 years.  He does not have any children.  He is a Nurse, learning disability at [employer].  He completed 15 grades of education.  There is no history of tobacco use.  He drinks wine and beer, approximately 6 drinks per week.  Family History: Family history is notable in that father is living at age 19.  Mother died at age 68 and had Parkinson's disease and died with aspiration.  He has 2 living sisters at 38 and 16.  ROS General: Negative; No fevers, chills, or night sweats;  HEENT: Negative; No changes in vision or hearing, sinus congestion, difficulty swallowing Pulmonary: Negative; No cough, wheezing, shortness of breath, hemoptysis Cardiovascular: Positive for hypertension, hyperlipidemia, no chest pain. GI: Recent documented gallstones and hepatic steatosis.  Diverticular disease. GU: Negative; No dysuria, hematuria, or difficulty voiding Musculoskeletal: Negative; no myalgias, joint pain, or weakness Hematologic/Oncology: Negative; no easy bruising, bleeding Endocrine: Negative; no heat/cold intolerance; no diabetes Neuro: Negative; no changes in balance, headaches Skin: Negative; No rashes or skin  lesions Psychiatric: Negative; No behavioral problems, depression Sleep: Negative; No snoring, daytime sleepiness, hypersomnolence, bruxism, restless legs, hypnogognic hallucinations, no cataplexy Other comprehensive 14 point system review is negative.   PHYSICAL EXAM:   VS:  BP 138/82   Pulse 96   Ht 6' (1.829 m)   Wt 258 lb 12.8 oz (117.4 kg)   SpO2 97%   BMI 35.10 kg/m     Repeat blood pressure by me was 124/76  Wt Readings from Last 3 Encounters:  01/01/22 258 lb 12.8 oz (117.4 kg)  10/17/21 242 lb 4 oz (109.9 kg)  10/16/21 248 lb 12.8 oz (112.9 kg)    General: Alert, oriented, no distress.  Skin: normal turgor, no rashes, warm and dry HEENT: Normocephalic, atraumatic. Pupils equal round and reactive to light; sclera anicteric; extraocular muscles intact;  Nose without nasal septal hypertrophy Mouth/Parynx benign; Mallinpatti scale 3 Neck: No JVD, no carotid bruits; normal carotid upstroke Lungs: clear to ausculatation and percussion; no wheezing or rales Chest wall: without tenderness to palpitation Heart: PMI not displaced, RRR, s1 s2 normal, 1/6 systolic murmur, no diastolic murmur, no rubs, gallops, thrills, or heaves Abdomen: soft, nontender; no hepatosplenomehaly, BS+; abdominal aorta nontender and not dilated by palpation. Back: no CVA tenderness Pulses 2+ Musculoskeletal: full range of motion, normal strength, no joint deformities Extremities: no clubbing cyanosis or edema, Homan's sign negative  Neurologic: grossly nonfocal; Cranial nerves grossly wnl Psychologic: Normal mood and affect   Studies/Labs Reviewed:   August 23, 2023ECG (independently read by me): NSR at 96, right axis deviation, Q III  October 16, 2021 ECG (independently read by me): NSR at 92; right superior axis, IRBBB  Recent Labs:    Latest Ref Rng & Units 10/11/2021    9:36 PM 04/25/2021    9:16 AM 07/03/2020   10:04 AM  BMP  Glucose 70 - 99 mg/dL 180  158  194   BUN 6 - 20 mg/dL 16  15   16    Creatinine 0.61 - 1.24 mg/dL 1.00  0.81  0.92   BUN/Creat Ratio 6 - 22 (calc)  NOT APPLICABLE  NOT APPLICABLE   Sodium 111 - 145 mmol/L 137  138  137   Potassium 3.5 - 5.1 mmol/L 3.7  4.0  4.4   Chloride 98 - 111 mmol/L 100  99  99   CO2 22 - 32 mmol/L 27  30  27    Calcium 8.9 - 10.3 mg/dL 10.1  9.7  9.7         Latest Ref Rng & Units 10/11/2021    9:36 PM 04/25/2021    9:16 AM 12/30/2019    9:31 AM  Hepatic Function  Total Protein 6.5 - 8.1 g/dL 7.6  7.6  7.5   Albumin 3.5 - 5.0 g/dL 4.4     AST 15 - 41 U/L 27  22  24    ALT 0 - 44 U/L 32  22  36   Alk Phosphatase 38 - 126 U/L 35     Total Bilirubin 0.3 - 1.2 mg/dL 1.2  1.0  0.9        Latest Ref Rng & Units 10/11/2021    8:39 PM 04/25/2021    9:16 AM 03/20/2020    4:59 PM  CBC  WBC 4.0 - 10.5 K/uL 11.9  5.4  13.2   Hemoglobin 13.0 - 17.0 g/dL 15.8  15.3  14.7   Hematocrit 39.0 - 52.0 % 44.5  45.1  42.5   Platelets 150 - 400 K/uL 266  296  317    Lab Results  Component Value Date   MCV 88.3 10/11/2021   MCV 89.7 04/25/2021   MCV 90.2 03/20/2020   Lab Results  Component Value Date   TSH 1.61 07/24/2015   Lab Results  Component Value Date   HGBA1C 6.4 (H) 10/15/2021     BNP No results found for: "BNP"  ProBNP No results found for: "PROBNP"   Lipid Panel     Component Value Date/Time   CHOL 202 (H) 10/15/2021 0920   TRIG 72 10/15/2021 0920   HDL 64 10/15/2021 0920   CHOLHDL 3.2 10/15/2021 0920   VLDL 17 08/11/2016 0950   LDLCALC 122 (H) 10/15/2021 0920     RADIOLOGY: No results found.   Additional studies/ records that were reviewed  today include:   I have reviewed the records of Dominic Garner as well as his [facility] evaluation from October 11, 2021.  Laboratory has been reviewed.  ECHO: 11/27/2021   1. Left ventricular ejection fraction by 3D volume is 55 %. The left  ventricle has normal function. The left ventricle has no regional wall  motion abnormalities. Left  ventricular diastolic parameters were normal.   2. Right ventricular systolic function is normal. The right ventricular  size is mildly enlarged.   3. The mitral valve is normal in structure. No evidence of mitral valve  regurgitation. No evidence of mitral stenosis.   4. The aortic valve is normal in structure. Aortic valve regurgitation is  not visualized. No aortic stenosis is present.   5. The inferior vena cava is normal in size with greater than 50%  respiratory variability, suggesting right atrial pressure of 3 mmHg.   Comparison(s): No prior Echocardiogram.   CALCIUM SCORE: 11/28/2021 IMPRESSION: Coronary calcium score of 0.   Note main pulmonary artery measures 3.3 cm consider further w/u / Echo if clinically indicated   IMPRESSION:   1. No acute findings in the chest. 2. Hepatic steatosis, at least moderate in severity. 3. Aortic atherosclerosis.   Aortic Atherosclerosis (ICD10-I70.0).      ASSESSMENT:    1. Primary hypertension   2. Pure hypercholesterolemia   3. Hepatic steatosis   4. Gallstones     PLAN:  Mr. Leane Call "[name] "Granberg is a 56 year old gentleman who is Nurse, learning disability at [employer] and has a 7-year history of hypertension as well as a history of pre-diabetes.  He denies any known history of CAD and denies any episodes of chest pain.  He walks regularly and denies exertional dyspnea.  He recently developed epigastric pain leading to urgent care with subsequent [place] evaluation and was found to have multiple gallstones with hepatic steatosis, and left colon diverticular disease without inflammation.  Lipid studies have demonstrated mild hyperlipidemia with LDL cholesterol in December 2022 at 142 but subsequently has slightly improved to 122 on repeat evaluation on October 15, 2021.  This is initial evaluation with me on October 17, 2001 he underwent a 2D echo Doppler study which essentially was normal and showed normal LV Solik and diastolic function  without valvular abnormalities.  With his mild cholesterol elevation, I recommended he undergo a coronary calcium score.  Calcium score was excellent at 0.  However he was noted to have some mild aortic atherosclerosis on CT over read.  He is trying to adjust his diet.  He will be undergoing repeat laboratory future with Dr. Renold Genta.  I would recommend target LDL at least less than 100 with his calcium score of 0 and evidence for mild aortic atherosclerosis.  I discussed improved diet.  He has moderately severe hepatic steatosis.  Discussed a plant-based as well as a Mediterranean heart healthy diet.  He admits to being under increased stress which at times contributes to his blood pressure elevation.  Blood pressure on repeat by me today was improved.  MRI is consistent with moderate obesity at 35.1.  8 loss and increased exercise was recommended.  He is diabetic on Onglyza and metformin.  Blood pressure remains controlled on losartan HCT 100/25.  I will see him in 1 year for reevaluation or sooner as needed.   Medication Adjustments/Labs and Tests Ordered: Current medicines are reviewed at length with the patient today.  Concerns regarding medicines are outlined above.  Medication changes, Labs and Tests ordered today are listed in the Patient Instructions below. Patient Instructions  Medication Instructions:  Your physician recommends that you continue on your current medications as directed. Please refer to the Current Medication list given to you today.  *If you need a refill on your cardiac medications before your next appointment, please call your pharmacy*   Follow-Up: At [facility], you and your health needs are our priority.  As part of our continuing mission to provide you with exceptional heart care, we have created designated Provider Care Teams.  These Care Teams include your primary Cardiologist (physician) and Advanced Practice Providers (APPs -  Physician Assistants and Nurse  Practitioners) who all work together to provide you with the care you need, when you need it.  We recommend signing up for the patient portal called "MyChart".  Sign up information is provided on this After Visit Summary.  MyChart is used to connect with patients for Virtual Visits (Telemedicine).  Patients are able to view lab/test results, encounter notes, upcoming appointments, etc.  Non-urgent messages can be sent to your provider as well.   To learn more about what you can do with MyChart, go to [email or web address].    Your next appointment:   12 month(s)  The format for your next appointment:   In Person  Provider:   Shelva Majestic, MD   Signed, Shelva Majestic, MD  01/01/2022 6:49 PM    [facility] [address] [place], [place], [place]  [address] Phone: [phone]

## 2022-01-23 ENCOUNTER — Other Ambulatory Visit: Payer: 59

## 2022-01-23 DIAGNOSIS — R7302 Impaired glucose tolerance (oral): Secondary | ICD-10-CM

## 2022-01-23 DIAGNOSIS — E119 Type 2 diabetes mellitus without complications: Secondary | ICD-10-CM

## 2022-01-24 ENCOUNTER — Encounter: Payer: Self-pay | Admitting: Internal Medicine

## 2022-01-24 ENCOUNTER — Ambulatory Visit (INDEPENDENT_AMBULATORY_CARE_PROVIDER_SITE_OTHER): Payer: 59 | Admitting: Internal Medicine

## 2022-01-24 VITALS — BP 126/84 | HR 87 | Temp 98.3°F | Ht 72.0 in | Wt 245.0 lb

## 2022-01-24 DIAGNOSIS — R8290 Unspecified abnormal findings in urine: Secondary | ICD-10-CM | POA: Diagnosis not present

## 2022-01-24 DIAGNOSIS — Z6833 Body mass index (BMI) 33.0-33.9, adult: Secondary | ICD-10-CM

## 2022-01-24 DIAGNOSIS — E7800 Pure hypercholesterolemia, unspecified: Secondary | ICD-10-CM | POA: Diagnosis not present

## 2022-01-24 DIAGNOSIS — E119 Type 2 diabetes mellitus without complications: Secondary | ICD-10-CM

## 2022-01-24 DIAGNOSIS — K760 Fatty (change of) liver, not elsewhere classified: Secondary | ICD-10-CM

## 2022-01-24 LAB — HEMOGLOBIN A1C
Hgb A1c MFr Bld: 6.2 % of total Hgb — ABNORMAL HIGH (ref <5.7)
Mean Plasma Glucose: 131 mg/dL
eAG (mmol/L): 7.3 mmol/L

## 2022-01-24 NOTE — Patient Instructions (Addendum)
Please continue to work on diet exercise and weight loss.  Weight loss will help fatty liver.  Continue current medications.  Health maintenance exam is due in March 2024.  No change in medications.  Recommend annual flu vaccine and COVID booster.  He says he will receive flu vaccine through his employment.  He is not sure if he will take COVID booster.  Needs to monitor blood pressure.

## 2022-01-24 NOTE — Progress Notes (Signed)
   Subjective:    Patient ID: Dominic Garner., male    DOB: Sep 21, 1965, 56 y.o.   MRN: [id number]  HPI 56 year old Male see for follow up on hypertension and glucose intolerance.He saw Dr. Tresa Endo, Cardiologist after presenting to ED in June with chest pain. Had RBBB and Q wave lead III without ST elevation or depression.CT of abdomen showed multiple gallstones and hepatic steatosis without biliary dilatation.  Has been on Onglyza and Metformin. Takes Prilosec OTC and is on Losartan HCTZ 100/25 daily.  His hemoglobin A1c is 6.2% and was 6.4% in June.  Liver functions were normal in June.  Lipid panel in June showed total cholesterol of 202 with an LDL cholesterol of 122, HDL of 64 and triglycerides of 72.  Dr. Tresa Endo saw him in June at which time he had no chest pain or shortness of breath.  He had a 2D echocardiogram that showed normal LV function with ejection fraction of 55% with normal diastolic parameters and no valvular abnormalities.  His coronary calcium score was excellent at 0.  CT of the chest did not show any acute chest findings.  He was found to have moderate hepatic steatosis and mild aortic atherosclerosis.  He continues on losartan HCTZ 100/25.  He takes low-dose omeprazole OTC for GERD.  Dr. Tresa Endo will see him again in a year which is excellent.  I like the idea of him having ongoing cardiology follow-up.  Dr. Tresa Endo would like to see his target LDL less than 100.  He needs to lose weight.  He needs to monitor his blood pressure.    Review of Systems declines pneumonia 20 vaccine- will get flu vaccine through employment. Tdap      Objective:   Physical Exam Vital signs reviewed.  BMI 33.23 weight 245 pounds blood pressure 126/84, pulse 87 regular temperature 98.3 degrees pulse oximetry 96% Skin: Warm and dry.  No carotid bruits.  Chest clear.  Cardiac exam: Regular rate and rhythm without ectopy or murmur.  Affect thought and judgment are normal.      Assessment & Plan:    Type 2 diabetes mellitus-stable on Onglyza and metformin  BMI 33.23 I would like to see BMI under 30 and closer to 25 if possible.  Needs to watch his diet and get more exercise.  Hypertension stable on Hyzaar 100/25 daily  GE reflux treated with OTC Prilosec 10 mg daily  Health maintenance-needs to have screening colonoscopy.  Vaccines discussed.  Plan: Return March 2024.  Work on diet exercise and weight loss and continue current medications.  Reminder about colonoscopy and vaccines.

## 2022-04-07 ENCOUNTER — Other Ambulatory Visit: Payer: Self-pay

## 2022-04-07 MED ORDER — LOSARTAN POTASSIUM-HCTZ 100-25 MG PO TABS: 1.0000 | ORAL_TABLET | Freq: Every day | ORAL | 1 refills | Status: DC

## 2022-04-07 MED ORDER — METFORMIN HCL 500 MG PO TABS: 1000.0000 mg | ORAL_TABLET | Freq: Two times a day (BID) | ORAL | 1 refills | Status: DC

## 2022-06-01 ENCOUNTER — Other Ambulatory Visit: Payer: Self-pay | Admitting: Internal Medicine

## 2022-07-10 NOTE — Progress Notes (Signed)
Patient Care Team: Elby Showers, MD as PCP - General (Internal Medicine) Troy Sine, MD as PCP - Cardiology (Cardiology)  Visit Date: 07/17/22  Subjective:    Patient ID: Dominic Garner. , Male   DOB: Jun 23, 1965, 57 y.o.    MRN: [id number]   56 y.o. Male presents today for annual comprehensive physical exam. Patient has a past medical history of ADD, hypertension, Type 2 diabetes mellitus.  History of Type 2 diabetes treated with Glucophage 1,000 mg two times daily with a meal, Onglyza 5 mg daily. Has not been taking Onglyza since January due to insurance coverage problems. HGBA1c at 6.6% on 07/14/22. Doing low-carb and intermittent fasting diet.  History of acid reflux treated with Prilosec 10 mg daily.  History of hypertension treated with losartan-hydrochlorothiazide 100-25 mg daily. Blood pressure normal today at 118/70.  LDL elevated at 128 on 07/14/22, lipid panel otherwise normal.  PSA normal at 0.47 on 04/25/21.  Past due for colonoscopy. Last completed in 2011 by Dr. Burnadette Peter. Reminded him to call for appt.   Past Medical History:  Diagnosis Date   ADD (attention deficit disorder)    Hypertension     Family history: Father with history of kidney stones and prostate cancer.  Mother with history of tremor requiring electrode implantation growing and was treated with Sinemet but was told she did not have Parkinson's disease.  She died in [id number] with complications from septicemia.  2 sisters in good health.   SHx: Married,no children. 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 Nonsmoker. Social ETOH consumption. Nonsmoker.     Review of Systems  Constitutional:  Negative for chills, fever, malaise/fatigue and weight loss.  HENT:  Negative for hearing loss, sinus pain and sore throat.   Respiratory:  Negative for cough and hemoptysis.   Cardiovascular:  Negative for chest pain, palpitations, leg swelling and PND.  Gastrointestinal:   Negative for abdominal pain, constipation, diarrhea, heartburn, nausea and vomiting.  Genitourinary:  Negative for dysuria, frequency and urgency.  Musculoskeletal:  Negative for back pain, myalgias and neck pain.  Skin:  Negative for itching and rash.  Neurological:  Negative for dizziness, tingling, seizures and headaches.  Endo/Heme/Allergies:  Negative for polydipsia.  Psychiatric/Behavioral:  Negative for depression. The patient is not nervous/anxious.    Occasional episodic back pain     Objective:   Vitals: BP 118/70   Pulse 80   Temp 98 F (36.7 C) (Tympanic)   Ht 5' 11.75" (1.822 m)   Wt 237 lb 12.8 oz (107.9 kg)   SpO2 98%   BMI 32.48 kg/m    Physical Exam Vitals and nursing note reviewed.  Constitutional:      General: He is awake. He is not in acute distress.    Appearance: Normal appearance. He is not ill-appearing or toxic-appearing.  HENT:     Head: Normocephalic and atraumatic.     Right Ear: Tympanic membrane, ear canal and external ear normal.     Left Ear: Tympanic membrane, ear canal and external ear normal.     Mouth/Throat:     Pharynx: Oropharynx is clear.  Eyes:     Extraocular Movements: Extraocular movements intact.     Pupils: Pupils are equal, round, and reactive to light.  Neck:     Thyroid: No thyroid mass, thyromegaly or thyroid tenderness.     Vascular: No carotid bruit.  Cardiovascular:     Rate and Rhythm: Normal rate and  regular rhythm. No extrasystoles are present.    Pulses: Normal pulses.          Dorsalis pedis pulses are 2+ on the right side and 2+ on the left side.       Posterior tibial pulses are 2+ on the right side and 2+ on the left side.     Heart sounds: Normal heart sounds. No murmur heard.    No friction rub. No gallop.  Pulmonary:     Effort: Pulmonary effort is normal.     Breath sounds: Normal breath sounds. No decreased breath sounds, wheezing, rhonchi or rales.  Chest:     Chest wall: No mass.  Abdominal:      Palpations: Abdomen is soft.     Tenderness: There is no abdominal tenderness.     Hernia: No hernia is present.  Genitourinary:    Prostate: Normal. Not enlarged and no nodules present.     Comments: Prostate smooth and symmetrical on examination. Musculoskeletal:     Cervical back: Normal range of motion.     Right lower leg: No edema.     Left lower leg: No edema.  Lymphadenopathy:     Cervical: No cervical adenopathy.     Upper Body:     Right upper body: No supraclavicular adenopathy.     Left upper body: No supraclavicular adenopathy.  Skin:    General: Skin is warm and dry.  Neurological:     General: No focal deficit present.     Mental Status: He is alert and oriented to person, place, and time. Mental status is at baseline.     Cranial Nerves: Cranial nerves 2-12 are intact.     Sensory: Sensation is intact.     Motor: Motor function is intact.     Coordination: Coordination is intact.     Gait: Gait is intact.     Deep Tendon Reflexes: Reflexes are normal and symmetric.  Psychiatric:        Attention and Perception: Attention normal.        Mood and Affect: Mood normal.        Speech: Speech normal.        Behavior: Behavior normal. Behavior is cooperative.        Thought Content: Thought content normal.        Cognition and Memory: Cognition and memory normal.        Judgment: Judgment normal.       Results:   Studies obtained and personally reviewed by me:   Labs:       Component Value Date/Time   NA 141 07/14/2022 0936   K 4.5 07/14/2022 0936   CL 102 07/14/2022 0936   CO2 28 07/14/2022 0936   GLUCOSE 154 (H) 07/14/2022 0936   BUN 15 07/14/2022 0936   CREATININE 0.94 07/14/2022 0936   CALCIUM 9.8 07/14/2022 0936   PROT 7.9 07/14/2022 0936   ALBUMIN 4.4 10/11/2021 2136   AST 14 07/14/2022 0936   ALT 15 07/14/2022 0936   ALKPHOS 35 (L) 10/11/2021 2136   BILITOT 0.8 07/14/2022 0936   GFRNONAA >60 10/11/2021 2136   GFRNONAA 85 12/30/2019 0931    GFRAA 99 12/30/2019 0931     Lab Results  Component Value Date   WBC 5.6 07/14/2022   HGB 15.3 07/14/2022   HCT 44.2 07/14/2022   MCV 87.0 07/14/2022   PLT 383 07/14/2022    Lab Results  Component Value Date   CHOL 197 07/14/2022  HDL 54 07/14/2022   LDLCALC 128 (H) 07/14/2022   TRIG 61 07/14/2022   CHOLHDL 3.6 07/14/2022    Lab Results  Component Value Date   HGBA1C 6.6 (H) 07/14/2022     Lab Results  Component Value Date   TSH 1.61 07/24/2015     Lab Results  Component Value Date   PSA 0.59 07/14/2022   PSA 0.47 04/25/2021   PSA 0.3 12/30/2019      Assessment & Plan:   Type 2 diabetes: treated with Glucophage 1,000 mg two times daily with a meal, Onglyza 5 mg daily. Has not been taking Onglyza since January due to insurance coverage problems. HGBA1c at 6.6% on 07/14/22. Doing low-carb and intermittent fasting diet.  He is going on a trip to [place] and agrees to return for follow-up in 6 months.  He will work on diet exercise and weight loss after he returns from [place].  Acid reflux: treated with Prilosec 10 mg daily.  Episodic back pain-no new episodes recently  Essential hypertension: treated with losartan-hydrochlorothiazide 100-25 mg daily. Blood pressure normal today at 118/70.  Family history of prostate cancer in father-PSA is normal  Past due for colonoscopy. Last completed in 2011 by Dr. Juanita Craver. Reminded to schedule a repeat.  Vaccine Counseling: Vaccines discussed.  Elevated LDL at 128-does not want to be on statin.  He saw Dr. Ellouise Newer in July 2023.  Had CT cardiac scoring and had aortic atherosclerosis and hepatic steatosis moderate in severity.  Coronary calcium score was 0.  BMI 32-his weight has decreased over the past year or so.  In 2021 he was 249 pounds with a BMI of 35.22.  Continue diet exercise and weight loss efforts.  Follow-up in 6 months.  I,Alexander Ruley,acting as a Education administrator for Elby Showers, MD.,have documented all  relevant documentation on the behalf of Elby Showers, MD,as directed by  Elby Showers, MD while in the presence of Elby Showers, MD.   I, Elby Showers, MD, have reviewed all documentation for this visit. The documentation on 08/03/22 for the exam, diagnosis, procedures, and orders are all accurate and complete.

## 2022-07-14 ENCOUNTER — Other Ambulatory Visit: Payer: 59

## 2022-07-14 DIAGNOSIS — Z125 Encounter for screening for malignant neoplasm of prostate: Secondary | ICD-10-CM

## 2022-07-14 DIAGNOSIS — R8290 Unspecified abnormal findings in urine: Secondary | ICD-10-CM

## 2022-07-14 DIAGNOSIS — E119 Type 2 diabetes mellitus without complications: Secondary | ICD-10-CM

## 2022-07-14 DIAGNOSIS — E7800 Pure hypercholesterolemia, unspecified: Secondary | ICD-10-CM

## 2022-07-15 LAB — HEMOGLOBIN A1C
Hgb A1c MFr Bld: 6.6 % of total Hgb — ABNORMAL HIGH (ref <5.7)
Mean Plasma Glucose: 143 mg/dL
eAG (mmol/L): 7.9 mmol/L

## 2022-07-15 LAB — COMPLETE METABOLIC PANEL WITH GFR
AG Ratio: 1.5 (calc) (ref 1.0–2.5)
ALT: 15 U/L (ref 9–46)
AST: 14 U/L (ref 10–35)
Albumin: 4.7 g/dL (ref 3.6–5.1)
Alkaline phosphatase (APISO): 37 U/L (ref 35–144)
BUN: 15 mg/dL (ref 7–25)
CO2: 28 mmol/L (ref 20–32)
Calcium: 9.8 mg/dL (ref 8.6–10.3)
Chloride: 102 mmol/L (ref 98–110)
Creat: 0.94 mg/dL (ref 0.70–1.30)
Globulin: 3.2 g/dL (calc) (ref 1.9–3.7)
Glucose, Bld: 154 mg/dL — ABNORMAL HIGH (ref 65–99)
Potassium: 4.5 mmol/L (ref 3.5–5.3)
Sodium: 141 mmol/L (ref 135–146)
Total Bilirubin: 0.8 mg/dL (ref 0.2–1.2)
Total Protein: 7.9 g/dL (ref 6.1–8.1)
eGFR: 95 mL/min/{1.73_m2} (ref >=60)

## 2022-07-15 LAB — MICROALBUMIN / CREATININE URINE RATIO
Creatinine, Urine: 166 mg/dL (ref 20–320)
Microalb Creat Ratio: 5 mcg/mg creat (ref <30)
Microalb, Ur: 0.8 mg/dL

## 2022-07-15 LAB — LIPID PANEL
Cholesterol: 197 mg/dL (ref <200)
HDL: 54 mg/dL (ref >=40)
LDL Cholesterol (Calc): 128 mg/dL (calc) — ABNORMAL HIGH
Non-HDL Cholesterol (Calc): 143 mg/dL (calc) — ABNORMAL HIGH (ref <130)
Total CHOL/HDL Ratio: 3.6 (calc) (ref <5.0)
Triglycerides: 61 mg/dL (ref <150)

## 2022-07-15 LAB — CBC WITH DIFFERENTIAL/PLATELET
Absolute Monocytes: 582 cells/uL (ref 200–950)
Basophils Absolute: 78 cells/uL (ref 0–200)
Basophils Relative: 1.4 %
Eosinophils Absolute: 101 cells/uL (ref 15–500)
Eosinophils Relative: 1.8 %
HCT: 44.2 % (ref 38.5–50.0)
Hemoglobin: 15.3 g/dL (ref 13.2–17.1)
Lymphs Abs: 1921 cells/uL (ref 850–3900)
MCH: 30.1 pg (ref 27.0–33.0)
MCHC: 34.6 g/dL (ref 32.0–36.0)
MCV: 87 fL (ref 80.0–100.0)
MPV: 10.1 fL (ref 7.5–12.5)
Monocytes Relative: 10.4 %
Neutro Abs: 2918 cells/uL (ref 1500–7800)
Neutrophils Relative %: 52.1 %
Platelets: 383 10*3/uL (ref 140–400)
RBC: 5.08 10*6/uL (ref 4.20–5.80)
RDW: 12.2 % (ref 11.0–15.0)
Total Lymphocyte: 34.3 %
WBC: 5.6 10*3/uL (ref 3.8–10.8)

## 2022-07-15 LAB — PSA: PSA: 0.59 ng/mL (ref <=4.00)

## 2022-07-17 ENCOUNTER — Encounter: Payer: Self-pay | Admitting: Internal Medicine

## 2022-07-17 ENCOUNTER — Ambulatory Visit (INDEPENDENT_AMBULATORY_CARE_PROVIDER_SITE_OTHER): Payer: 59 | Admitting: Internal Medicine

## 2022-07-17 VITALS — BP 118/70 | HR 80 | Temp 98.0°F | Ht 71.75 in | Wt 237.8 lb

## 2022-07-17 DIAGNOSIS — R8290 Unspecified abnormal findings in urine: Secondary | ICD-10-CM

## 2022-07-17 DIAGNOSIS — Z6832 Body mass index (BMI) 32.0-32.9, adult: Secondary | ICD-10-CM

## 2022-07-17 DIAGNOSIS — E119 Type 2 diabetes mellitus without complications: Secondary | ICD-10-CM | POA: Diagnosis not present

## 2022-07-17 DIAGNOSIS — E7800 Pure hypercholesterolemia, unspecified: Secondary | ICD-10-CM | POA: Diagnosis not present

## 2022-07-17 DIAGNOSIS — Z0000 Encounter for general adult medical examination without abnormal findings: Secondary | ICD-10-CM

## 2022-07-17 DIAGNOSIS — Z8042 Family history of malignant neoplasm of prostate: Secondary | ICD-10-CM

## 2022-07-17 DIAGNOSIS — K219 Gastro-esophageal reflux disease without esophagitis: Secondary | ICD-10-CM

## 2022-07-18 LAB — POCT URINALYSIS DIPSTICK
Bilirubin, UA: NEGATIVE
Blood, UA: NEGATIVE
Glucose, UA: NEGATIVE
Ketones, UA: NEGATIVE
Leukocytes, UA: NEGATIVE
Nitrite, UA: NEGATIVE
Protein, UA: NEGATIVE
Spec Grav, UA: 1.01 (ref 1.010–1.025)
Urobilinogen, UA: 0.2 E.U./dL
pH, UA: 5 (ref 5.0–8.0)

## 2022-08-03 DIAGNOSIS — E119 Type 2 diabetes mellitus without complications: Secondary | ICD-10-CM | POA: Insufficient documentation

## 2022-08-03 DIAGNOSIS — Z6832 Body mass index (BMI) 32.0-32.9, adult: Secondary | ICD-10-CM | POA: Insufficient documentation

## 2022-08-03 DIAGNOSIS — Z8042 Family history of malignant neoplasm of prostate: Secondary | ICD-10-CM | POA: Insufficient documentation

## 2022-08-03 NOTE — Patient Instructions (Addendum)
It was a pleasure to see you today.  Please continue to work on diet and exercise efforts.  Tetanus immunization is up-to-date.  Hemoglobin A1c is stable at 6.6%.  Please contact Dr. Collene Mares regarding repeat colonoscopy.  Continue current medications.  We will see you again in 6 months.  Reminded about annual diabetic eye exam.  This is requested by [facility] and has been this practice.  Vaccines discussed.  Tetanus immunization is up-to-date.  Needs to have colonoscopy.  PSA is normal.

## 2022-09-29 ENCOUNTER — Other Ambulatory Visit: Payer: Self-pay | Admitting: Internal Medicine

## 2022-11-11 ENCOUNTER — Ambulatory Visit (INDEPENDENT_AMBULATORY_CARE_PROVIDER_SITE_OTHER): Payer: 59 | Admitting: Internal Medicine

## 2022-11-11 ENCOUNTER — Telehealth: Payer: Self-pay | Admitting: Internal Medicine

## 2022-11-11 VITALS — BP 122/76 | HR 61 | Temp 97.6°F | Resp 16 | Ht 71.75 in | Wt 235.0 lb

## 2022-11-11 DIAGNOSIS — E119 Type 2 diabetes mellitus without complications: Secondary | ICD-10-CM

## 2022-11-11 DIAGNOSIS — R52 Pain, unspecified: Secondary | ICD-10-CM | POA: Diagnosis not present

## 2022-11-11 DIAGNOSIS — R5383 Other fatigue: Secondary | ICD-10-CM

## 2022-11-11 DIAGNOSIS — R5381 Other malaise: Secondary | ICD-10-CM

## 2022-11-11 DIAGNOSIS — N410 Acute prostatitis: Secondary | ICD-10-CM | POA: Diagnosis not present

## 2022-11-11 LAB — POCT URINALYSIS DIPSTICK
Bilirubin, UA: NEGATIVE
Blood, UA: NEGATIVE
Glucose, UA: NEGATIVE
Ketones, UA: NEGATIVE
Leukocytes, UA: NEGATIVE
Nitrite, UA: NEGATIVE
Protein, UA: NEGATIVE
Spec Grav, UA: 1.01 (ref 1.010–1.025)
Urobilinogen, UA: 0.2 E.U./dL
pH, UA: 6 (ref 5.0–8.0)

## 2022-11-11 LAB — POCT CBG (FASTING - GLUCOSE)-MANUAL ENTRY: Glucose Fasting, POC: 146 mg/dL — AB (ref 70–99)

## 2022-11-11 LAB — POC COVID19 BINAXNOW: SARS Coronavirus 2 Ag: NEGATIVE

## 2022-11-11 LAB — CBC WITH DIFFERENTIAL/PLATELET
Absolute Monocytes: 1356 cells/uL — ABNORMAL HIGH (ref 200–950)
Basophils Absolute: 72 cells/uL (ref 0–200)
Eosinophils Absolute: 84 cells/uL (ref 15–500)
Lymphs Abs: 2052 cells/uL (ref 850–3900)
MCHC: 33.8 g/dL (ref 32.0–36.0)
MPV: 9.9 fL (ref 7.5–12.5)
Monocytes Relative: 11.3 %
Neutro Abs: 8436 cells/uL — ABNORMAL HIGH (ref 1500–7800)
RBC: 5.41 10*6/uL (ref 4.20–5.80)

## 2022-11-11 MED ORDER — DOXYCYCLINE HYCLATE 100 MG PO TABS: 100.0000 mg | ORAL_TABLET | Freq: Two times a day (BID) | ORAL | 0 refills | Status: DC

## 2022-11-11 MED ORDER — CYCLOBENZAPRINE HCL 10 MG PO TABS: 10.0000 mg | ORAL_TABLET | Freq: Three times a day (TID) | ORAL | 0 refills | Status: AC | PRN

## 2022-11-11 NOTE — Telephone Encounter (Signed)
Patient said he feels like he has lime disease, he said he had it last year and its the same symptoms. He feels tired and lethargic and achy in his joints. He said he doesn't see any tick bites like last time though. He would like to be seen this afternoon

## 2022-11-11 NOTE — Progress Notes (Addendum)
Patient Care Team: Margaree Mackintosh, MD as PCP - General (Internal Medicine) Lennette Bihari, MD as PCP - Cardiology (Cardiology)  Visit Date: 11/11/22  Subjective:    Patient ID: Dominic Garner. , Male   DOB: 1965/08/01, 57 y.o.    MRN: [id number]   57 y.o. Male presents today for fatigue and body aches. He states that his symptoms began last Saturday, initially as back pain but progressing to more widespread aching myalgias. This was in the setting of a recent trip to the beach. Currently his back pain is largely resolved, but he continues to have low energy levels. He is afebrile; denies chills, nausea, vomiting, diarrhea, headache, dysuria. No recent international travel history,   He is concerned because his symptoms are very similar to how he felt last year 10/2021 when he was treated for tick bite with doxycycline. He is not aware of any recent tick bites, and denies known sick contacts.  Additionally he complains of some insomnia.  His blood pressure is well controlled today.  Past Medical History:  Diagnosis Date   ADD (attention deficit disorder)    Hypertension      Family History  Problem Relation Age of Onset   Urolithiasis Father     Social History   Social History Narrative   Not on file      Review of Systems  Constitutional:  Positive for malaise/fatigue. Negative for chills and fever.  HENT:  Negative for congestion.   Eyes:  Negative for blurred vision.  Respiratory:  Negative for cough and shortness of breath.   Cardiovascular:  Negative for chest pain, palpitations and leg swelling.  Gastrointestinal:  Negative for diarrhea, nausea and vomiting.  Genitourinary:  Negative for dysuria, hematuria and urgency.  Musculoskeletal:  Positive for myalgias. Negative for back pain.  Skin:  Negative for rash.  Neurological:  Negative for loss of consciousness and headaches.  Psychiatric/Behavioral:  The patient has insomnia.         Objective:    Vitals: BP 122/76   Pulse 61   Temp 97.6 F (36.4 C) (Tympanic)   Resp 16   Ht 5' 11.75" (1.822 m)   Wt 235 lb (106.6 kg)   SpO2 96%   BMI 32.09 kg/m    Physical Exam Vitals and nursing note reviewed.  Constitutional:      General: He is not in acute distress.    Appearance: Normal appearance. He is not ill-appearing.  HENT:     Head: Normocephalic and atraumatic.  Pulmonary:     Effort: Pulmonary effort is normal.  Genitourinary:    Comments: Boggy prostate noted. Skin:    General: Skin is warm and dry.  Neurological:     Mental Status: He is alert and oriented to person, place, and time. Mental status is at baseline.  Psychiatric:        Mood and Affect: Mood normal.        Behavior: Behavior normal.        Thought Content: Thought content normal.        Judgment: Judgment normal.       Results:   Studies obtained and personally reviewed by me:   Labs:       Component Value Date/Time   NA 141 07/14/2022 0936   K 4.5 07/14/2022 0936   CL 102 07/14/2022 0936   CO2 28 07/14/2022 0936   GLUCOSE 154 (H) 07/14/2022 0936   BUN 15 07/14/2022 0936  CREATININE 0.94 07/14/2022 0936   CALCIUM 9.8 07/14/2022 0936   PROT 7.9 07/14/2022 0936   ALBUMIN 4.4 10/11/2021 2136   AST 14 07/14/2022 0936   ALT 15 07/14/2022 0936   ALKPHOS 35 (L) 10/11/2021 2136   BILITOT 0.8 07/14/2022 0936   GFRNONAA >60 10/11/2021 2136   GFRNONAA 85 12/30/2019 0931   GFRAA 99 12/30/2019 0931     Lab Results  Component Value Date   WBC 5.6 07/14/2022   HGB 15.3 07/14/2022   HCT 44.2 07/14/2022   MCV 87.0 07/14/2022   PLT 383 07/14/2022    Lab Results  Component Value Date   CHOL 197 07/14/2022   HDL 54 07/14/2022   LDLCALC 128 (H) 07/14/2022   TRIG 61 07/14/2022   CHOLHDL 3.6 07/14/2022    Lab Results  Component Value Date   HGBA1C 6.6 (H) 07/14/2022     Lab Results  Component Value Date   TSH 1.61 07/24/2015     Lab Results  Component Value Date   PSA  0.59 07/14/2022   PSA 0.47 04/25/2021   PSA 0.3 12/30/2019      Assessment & Plan:  Malaise and fatigue-etiology unclear.  Prostate is boggy on exam and he will be treated for Prostatitis: Have ordered fasting glucose, CBC, CMP, Hemoglobin A1C, TSH, Free T4, Mononucleosis screening. UA negative/clear.    Have Prescribed Flexeril 10 mg p.o. 3 times daily as needed for muscle spasms. Prescribed Doxycycline 100 mg p.o. 2 times daily for 10 days.  Urine culture ordered.  Follow-up in September for regular follow-up appointment or sooner if worse.  White blood cell count is elevated at 12,000 indicating there may be some infection going on.  Since she is going out of town on vacation, I think it is best to have him on antibiotics for a few days for presumed prostatitis.  He will call if symptoms worsen or not improving.  Impaired glucose tolerance-hemoglobin A1c stable at 6.5% ; nonfasting glucose 143  Follow up scheduled for 01/22/2023.  I,Alexander Ruley,acting as a Neurosurgeon for Margaree Mackintosh, MD.,have documented all relevant documentation on the behalf of Margaree Mackintosh, MD,as directed by  Margaree Mackintosh, MD while in the presence of Margaree Mackintosh, MD.   I, Margaree Mackintosh, MD, have reviewed all documentation for this visit. The documentation on 11/16/22 for the exam, diagnosis, procedures, and orders are all accurate and complete.

## 2022-11-11 NOTE — Telephone Encounter (Signed)
Scheduled

## 2022-11-12 LAB — CBC WITH DIFFERENTIAL/PLATELET
Basophils Relative: 0.6 %
Eosinophils Relative: 0.7 %
HCT: 46.8 % (ref 38.5–50.0)
Hemoglobin: 15.8 g/dL (ref 13.2–17.1)
MCH: 29.2 pg (ref 27.0–33.0)
MCV: 86.5 fL (ref 80.0–100.0)
Neutrophils Relative %: 70.3 %
Platelets: 326 10*3/uL (ref 140–400)
RDW: 13.4 % (ref 11.0–15.0)
Total Lymphocyte: 17.1 %
WBC: 12 10*3/uL — ABNORMAL HIGH (ref 3.8–10.8)

## 2022-11-12 LAB — COMPLETE METABOLIC PANEL WITH GFR
AG Ratio: 1.7 (calc) (ref 1.0–2.5)
ALT: 14 U/L (ref 9–46)
AST: 15 U/L (ref 10–35)
Albumin: 5.1 g/dL (ref 3.6–5.1)
Alkaline phosphatase (APISO): 47 U/L (ref 35–144)
BUN: 18 mg/dL (ref 7–25)
CO2: 29 mmol/L (ref 20–32)
Calcium: 9.8 mg/dL (ref 8.6–10.3)
Chloride: 97 mmol/L — ABNORMAL LOW (ref 98–110)
Creat: 0.92 mg/dL (ref 0.70–1.30)
Globulin: 3 g/dL (calc) (ref 1.9–3.7)
Glucose, Bld: 143 mg/dL — ABNORMAL HIGH (ref 65–99)
Potassium: 3.9 mmol/L (ref 3.5–5.3)
Sodium: 136 mmol/L (ref 135–146)
Total Bilirubin: 1.2 mg/dL (ref 0.2–1.2)
Total Protein: 8.1 g/dL (ref 6.1–8.1)
eGFR: 96 mL/min/{1.73_m2} (ref >=60)

## 2022-11-12 LAB — T4, FREE: Free T4: 1.3 ng/dL (ref 0.8–1.8)

## 2022-11-12 LAB — HEMOGLOBIN A1C
Hgb A1c MFr Bld: 6.5 % of total Hgb — ABNORMAL HIGH (ref <5.7)
Mean Plasma Glucose: 140 mg/dL
eAG (mmol/L): 7.7 mmol/L

## 2022-11-12 LAB — MONONUCLEOSIS SCREEN: Heterophile, Mono Screen: NEGATIVE

## 2022-11-12 LAB — TSH: TSH: 1.19 mIU/L (ref 0.40–4.50)

## 2022-11-16 ENCOUNTER — Encounter: Payer: Self-pay | Admitting: Internal Medicine

## 2022-11-16 NOTE — Patient Instructions (Addendum)
You have been diagnosed with prostatitis.  Prostate is boggy on exam.  You were given doxycycline 100 mg twice daily for 10 days.  May take Flexeril for musculoskeletal pain.  Labs are drawn and pending.  Please keep upcoming appointment on September 12.

## 2022-11-20 NOTE — Telephone Encounter (Signed)
Dominic Garner called and said Tuesday and Wednesday he was feeling the same as he did last week, so he was concerned he was feeling that way again, does feel some better today. Wanted you to know, and was wandering if he should take another round of medicine or come and see you again?

## 2022-11-20 NOTE — Telephone Encounter (Signed)
scheduled

## 2022-11-21 ENCOUNTER — Encounter: Payer: Self-pay | Admitting: Internal Medicine

## 2022-11-21 ENCOUNTER — Ambulatory Visit (INDEPENDENT_AMBULATORY_CARE_PROVIDER_SITE_OTHER): Payer: 59 | Admitting: Internal Medicine

## 2022-11-21 VITALS — BP 118/80 | HR 71 | Temp 98.7°F | Resp 16

## 2022-11-21 DIAGNOSIS — R52 Pain, unspecified: Secondary | ICD-10-CM

## 2022-11-21 DIAGNOSIS — R5383 Other fatigue: Secondary | ICD-10-CM

## 2022-11-21 DIAGNOSIS — R5381 Other malaise: Secondary | ICD-10-CM | POA: Diagnosis not present

## 2022-11-21 LAB — CBC WITH DIFFERENTIAL/PLATELET
Absolute Monocytes: 709 cells/uL (ref 200–950)
Basophils Absolute: 59 cells/uL (ref 0–200)
Basophils Relative: 0.9 %
Eosinophils Absolute: 91 cells/uL (ref 15–500)
Eosinophils Relative: 1.4 %
HCT: 43.4 % (ref 38.5–50.0)
Hemoglobin: 14.6 g/dL (ref 13.2–17.1)
Lymphs Abs: 2119 cells/uL (ref 850–3900)
MCH: 29 pg (ref 27.0–33.0)
MCHC: 33.6 g/dL (ref 32.0–36.0)
MCV: 86.1 fL (ref 80.0–100.0)
MPV: 10 fL (ref 7.5–12.5)
Monocytes Relative: 10.9 %
Neutro Abs: 3523 cells/uL (ref 1500–7800)
Neutrophils Relative %: 54.2 %
Platelets: 399 10*3/uL (ref 140–400)
RBC: 5.04 10*6/uL (ref 4.20–5.80)
RDW: 13.3 % (ref 11.0–15.0)
Total Lymphocyte: 32.6 %
WBC: 6.5 10*3/uL (ref 3.8–10.8)

## 2022-11-21 MED ORDER — DOXYCYCLINE HYCLATE 100 MG PO TABS: 100.0000 mg | ORAL_TABLET | Freq: Two times a day (BID) | ORAL | 0 refills | Status: DC

## 2022-11-21 NOTE — Progress Notes (Signed)
Patient Care Team: Margaree Mackintosh, MD as PCP - General (Internal Medicine) Lennette Bihari, MD as PCP - Cardiology (Cardiology)  Visit Date: 11/21/22  Subjective:    Patient ID: Dominic Garner. , Male   DOB: [birth year], 57 y.o.    MRN: [id number]   57 y.o. Male presents today for follow-up. He continues to feel unwell. He had started the antibiotics on Tuesday of last week and completed the course yesterday. He had been feeling better by the time of July 4th, but he gradually felt worse after that. He states that he "just felt puny." He denies any sore throat, headache, or fever.  Past Medical History:  Diagnosis Date   ADD (attention deficit disorder)    Hypertension      Family History  Problem Relation Age of Onset   Urolithiasis Father     Social Hx: married, no children. 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    Review of Systems  Constitutional:  Positive for malaise/fatigue. Negative for chills and fever.  HENT:  Negative for congestion.   Eyes:  Negative for blurred vision.  Respiratory:  Negative for cough and shortness of breath.   Cardiovascular:  Negative for chest pain, palpitations and leg swelling.  Gastrointestinal:  Negative for diarrhea, nausea and vomiting.  Genitourinary:  Negative for dysuria, hematuria and urgency.  Musculoskeletal:  Negative for back pain.  Skin:  Negative for rash.  Neurological:  Negative for loss of consciousness and headaches.       Objective:   Afebrile in NAD   Physical Exam Vitals and nursing note reviewed.  Constitutional:      General: He is not in acute distress.    Appearance: Normal appearance. He is not ill-appearing.  HENT:     Head: Normocephalic and atraumatic.     Right Ear: Tympanic membrane, ear canal and external ear normal.     Left Ear: Tympanic membrane, ear canal and external ear normal.     Mouth/Throat:     Mouth: Mucous membranes are moist.  Cardiovascular:     Rate and  Rhythm: Normal rate and regular rhythm.     Pulses: Normal pulses.     Heart sounds: Normal heart sounds. No murmur heard.    No gallop.  Pulmonary:     Effort: Pulmonary effort is normal. No respiratory distress.     Breath sounds: Normal breath sounds. No wheezing or rales.  Musculoskeletal:     Cervical back: Normal range of motion and neck supple.  Lymphadenopathy:     Cervical: No cervical adenopathy.  Skin:    General: Skin is warm and dry.  Neurological:     Mental Status: He is alert and oriented to person, place, and time. Mental status is at baseline.  Psychiatric:        Mood and Affect: Mood normal.        Behavior: Behavior normal.        Thought Content: Thought content normal.        Judgment: Judgment normal.       Results:   Studies obtained and personally reviewed by me:   Labs:       Component Value Date/Time   NA 136 11/11/2022 1451   K 3.9 11/11/2022 1451   CL 97 (L) 11/11/2022 1451   CO2 29 11/11/2022 1451   GLUCOSE 143 (H) 11/11/2022 1451   BUN 18 11/11/2022 1451   CREATININE 0.92 11/11/2022 1451  CALCIUM 9.8 11/11/2022 1451   PROT 8.1 11/11/2022 1451   ALBUMIN 4.4 10/11/2021 2136   AST 15 11/11/2022 1451   ALT 14 11/11/2022 1451   ALKPHOS 35 (L) 10/11/2021 2136   BILITOT 1.2 11/11/2022 1451   GFRNONAA >60 10/11/2021 2136   GFRNONAA 85 12/30/2019 0931   GFRAA 99 12/30/2019 0931     Lab Results  Component Value Date   WBC 12.0 (H) 11/11/2022   HGB 15.8 11/11/2022   HCT 46.8 11/11/2022   MCV 86.5 11/11/2022   PLT 326 11/11/2022    Lab Results  Component Value Date   CHOL 197 07/14/2022   HDL 54 07/14/2022   LDLCALC 128 (H) 07/14/2022   TRIG 61 07/14/2022   CHOLHDL 3.6 07/14/2022    Lab Results  Component Value Date   HGBA1C 6.5 (H) 11/11/2022     Lab Results  Component Value Date   TSH 1.19 11/11/2022     Lab Results  Component Value Date   PSA 0.59 07/14/2022   PSA 0.47 04/25/2021   PSA 0.3 12/30/2019       Assessment & Plan:   Prostatitis: He completed his initial antibiotic course but he has not improved. We will prescribe another 10-day course of doxycycline. Prostate was not re-examined.  CBC was ordered today and is entirely normal.  Mononucleosis screening was negative.     I,Mathew Stumpf,acting as a Neurosurgeon for Margaree Mackintosh, MD.,have documented all relevant documentation on the behalf of Margaree Mackintosh, MD,as directed by  Margaree Mackintosh, MD while in the presence of Margaree Mackintosh, MD.  Rollen Sox, have reviewed all documentation for this visit. The documentation on 11/21/22 for the exam, diagnosis, procedures, and orders are all accurate and complete.

## 2022-12-07 NOTE — Patient Instructions (Signed)
It did seem to respond initially to doxycycline.  He still does not feel 100%.  CBC checked today is entirely normal.  Mononucleosis screening was negative.  We are going to represcribe another 10-day course of doxycycline and see if symptoms improve.  It was suspected he might have acute prostatitis at the time he was seen initially.

## 2023-01-15 NOTE — Progress Notes (Signed)
Patient Care Team: Margaree Mackintosh, MD as PCP - General (Internal Medicine) Lennette Bihari, MD as PCP - Cardiology (Cardiology)  Visit Date: 01/22/23  Subjective:    Patient ID: Dominic Garner. , Male   DOB: 16-Jun-1965, 57 y.o.    MRN: [id number]   57 y.o. Male presents today for a 6 month follow-up. History of ADD, hypertension, Type 2 diabetes mellitus, hyperlipidemia, GERD.  History of Type 2 diabetes mellitus treated with metformin 1000 mg twice daily with meals. Glucose elevated at 141. HGBA1c at 6.4% on 01/19/23, down from 6.5% on 11/11/22. He has not had his annual diebetic eye exam. He is doing an intermittent fasting diet where he only eats in an 8 hour period. He would like to work on healthy diet and exercise before adding another medication.  History of hypertension treated with losartan-hydrochlorothiazide 100-25 mg daily. Blood pressure normal today at 120/80.  History of hyperlipidemia. Does not want to be on statin. LDL elevated at 110 on 01/19/23, down from 128 on 07/14/22.   History of GERD treated with omeprazole 10 mg daily.  Had colonoscopy on 01/16/23 that showed diverticulosis in entire examined colon. 10 year repeat recommended.   Kidney functions normal. Electrolytes normal.  Denies swelling in feet.  Social history: Married, no children. 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 Nonsmoker. Social ETOH consumption. Nonsmoker.  Family history: Father with history of kidney stones and prostate cancer.  Mother with history of tremor requiring electrode implantation growing and was treated with Sinemet but was told she did not have Parkinson's disease.  She died in February 14, 2018 with complications from septicemia.  2 sisters in good health.  Past Medical History:  Diagnosis Date   ADD (attention deficit disorder)    Hypertension      Family History  Problem Relation Age of Onset   Urolithiasis Father     Social Hx: Married. No children.  Works with [employer] and [employer]. Non-smoker. Social alcohol consumption.     Review of Systems  Constitutional:  Negative for fever and malaise/fatigue.  HENT:  Negative for congestion.   Eyes:  Negative for blurred vision.  Respiratory:  Negative for cough and shortness of breath.   Cardiovascular:  Negative for chest pain, palpitations and leg swelling.  Gastrointestinal:  Negative for vomiting.  Musculoskeletal:  Negative for back pain.  Skin:  Negative for rash.  Neurological:  Negative for loss of consciousness and headaches.        Objective:   Vitals: BP 120/80   Pulse 90   Ht 5' 11.5" (1.816 m)   Wt 234 lb (106.1 kg)   SpO2 97%   BMI 32.18 kg/m    Physical Exam Constitutional:      General: He is not in acute distress.    Appearance: Normal appearance. He is not ill-appearing.  HENT:     Head: Normocephalic and atraumatic.  Cardiovascular:     Rate and Rhythm: Normal rate and regular rhythm.     Pulses: Normal pulses.     Heart sounds: Normal heart sounds. No murmur heard.    No friction rub. No gallop.  Pulmonary:     Effort: Pulmonary effort is normal. No respiratory distress.     Breath sounds: Normal breath sounds. No wheezing or rales.  Musculoskeletal:     Right lower leg: No edema.     Left lower leg: No edema.  Skin:    General: Skin  is warm and dry.  Neurological:     Mental Status: He is alert and oriented to person, place, and time. Mental status is at baseline.  Psychiatric:        Mood and Affect: Mood normal.        Behavior: Behavior normal.        Thought Content: Thought content normal.        Judgment: Judgment normal.       Results:   Studies obtained and personally reviewed by me:  Had colonoscopy on 01/16/23 that showed diverticulosis in entire examined colon. 10 year repeat recommended.   Labs:       Component Value Date/Time   NA 139 01/19/2023 1158   K 4.5 01/19/2023 1158   CL 102 01/19/2023 1158   CO2  24 01/19/2023 1158   GLUCOSE 141 (H) 01/19/2023 1158   BUN 19 01/19/2023 1158   CREATININE 0.92 01/19/2023 1158   CALCIUM 9.6 01/19/2023 1158   PROT 8.1 11/11/2022 1451   ALBUMIN 4.4 10/11/2021 2136   AST 15 11/11/2022 1451   ALT 14 11/11/2022 1451   ALKPHOS 35 (L) 10/11/2021 2136   BILITOT 1.2 11/11/2022 1451   GFRNONAA >60 10/11/2021 2136   GFRNONAA 85 12/30/2019 0931   GFRAA 99 12/30/2019 0931     Lab Results  Component Value Date   WBC 6.5 11/21/2022   HGB 14.6 11/21/2022   HCT 43.4 11/21/2022   MCV 86.1 11/21/2022   PLT 399 11/21/2022    Lab Results  Component Value Date   CHOL 181 01/19/2023   HDL 53 01/19/2023   LDLCALC 110 (H) 01/19/2023   TRIG 88 01/19/2023   CHOLHDL 3.4 01/19/2023    Lab Results  Component Value Date   HGBA1C 6.4 (H) 01/19/2023     Lab Results  Component Value Date   TSH 1.19 11/11/2022     Lab Results  Component Value Date   PSA 0.59 07/14/2022   PSA 0.47 04/25/2021   PSA 0.3 12/30/2019      Assessment & Plan:   Type 2 diabetes mellitus: treated with metformin 1000 mg twice daily with meals. Glucose elevated at 141. HGBA1c at 6.4% on 01/19/23, down from 6.5% on 11/11/22. He prefers to work on [employer] and exercise before adding another medication.   Hypertension: treated with losartan-hydrochlorothiazide 100-25 mg daily. Blood pressure normal today at 120/80.  Hyperlipidemia: LDL elevated at 110 on 01/19/23, down from 128 on 07/14/22. Does not want to be on statin medication.  GERD: treated with omeprazole 10 mg daily.  Reminded of diabetic eye exam.  Had colonoscopy on 01/16/23 that showed diverticulosis in entire examined colon. 10 year repeat recommended.   Return in 6 months for health maintenance exam or as needed.  Will have flu vaccine through employment at [employer].    I,Alexander Ruley,acting as a Neurosurgeon for Margaree Mackintosh, MD.,have documented all relevant documentation on the behalf of Margaree Mackintosh, MD,as  directed by  Margaree Mackintosh, MD while in the presence of Margaree Mackintosh, MD.   I, Margaree Mackintosh, MD, have reviewed all documentation for this visit. The documentation on 01/22/23 for the exam, diagnosis, procedures, and orders are all accurate and complete.

## 2023-01-16 LAB — HM COLONOSCOPY

## 2023-01-19 ENCOUNTER — Other Ambulatory Visit: Payer: 59

## 2023-01-19 DIAGNOSIS — R8290 Unspecified abnormal findings in urine: Secondary | ICD-10-CM

## 2023-01-19 DIAGNOSIS — E119 Type 2 diabetes mellitus without complications: Secondary | ICD-10-CM

## 2023-01-19 DIAGNOSIS — E7800 Pure hypercholesterolemia, unspecified: Secondary | ICD-10-CM

## 2023-01-20 LAB — BASIC METABOLIC PANEL
BUN: 19 mg/dL (ref 7–25)
CO2: 24 mmol/L (ref 20–32)
Calcium: 9.6 mg/dL (ref 8.6–10.3)
Chloride: 102 mmol/L (ref 98–110)
Creat: 0.92 mg/dL (ref 0.70–1.30)
Glucose, Bld: 141 mg/dL — ABNORMAL HIGH (ref 65–99)
Potassium: 4.5 mmol/L (ref 3.5–5.3)
Sodium: 139 mmol/L (ref 135–146)

## 2023-01-20 LAB — LIPID PANEL
Cholesterol: 181 mg/dL (ref <200)
HDL: 53 mg/dL (ref >=40)
LDL Cholesterol (Calc): 110 mg/dL — ABNORMAL HIGH
Non-HDL Cholesterol (Calc): 128 mg/dL (ref <130)
Total CHOL/HDL Ratio: 3.4 (calc) (ref <5.0)
Triglycerides: 88 mg/dL (ref <150)

## 2023-01-20 LAB — HEMOGLOBIN A1C
Hgb A1c MFr Bld: 6.4 %{Hb} — ABNORMAL HIGH (ref <5.7)
Mean Plasma Glucose: 137 mg/dL
eAG (mmol/L): 7.6 mmol/L

## 2023-01-22 ENCOUNTER — Ambulatory Visit (INDEPENDENT_AMBULATORY_CARE_PROVIDER_SITE_OTHER): Payer: 59 | Admitting: Internal Medicine

## 2023-01-22 ENCOUNTER — Encounter: Payer: Self-pay | Admitting: Internal Medicine

## 2023-01-22 VITALS — BP 120/80 | HR 90 | Ht 71.5 in | Wt 234.0 lb

## 2023-01-22 DIAGNOSIS — E119 Type 2 diabetes mellitus without complications: Secondary | ICD-10-CM

## 2023-01-22 DIAGNOSIS — R8290 Unspecified abnormal findings in urine: Secondary | ICD-10-CM

## 2023-01-22 DIAGNOSIS — E7800 Pure hypercholesterolemia, unspecified: Secondary | ICD-10-CM

## 2023-01-22 NOTE — Patient Instructions (Signed)
Reminded about eye exam. Reminded about vaccines. Will get flu vaccine at work. Wants to continue same meds.

## 2023-03-26 ENCOUNTER — Other Ambulatory Visit: Payer: Self-pay | Admitting: Internal Medicine

## 2023-07-20 ENCOUNTER — Other Ambulatory Visit: Payer: 59

## 2023-07-24 ENCOUNTER — Encounter: Payer: 59 | Admitting: Internal Medicine

## 2023-07-29 NOTE — Progress Notes (Signed)
 Annual Wellness Visit   Patient Care Team: Marlen Koman, Jaynie Meyers, MD as PCP - General (Internal Medicine) Millicent Ally, MD as PCP - Cardiology (Cardiology)  Visit Date: 08/14/23   Chief Complaint  Patient presents with   Annual Exam   Subjective:  Patient: Dominic Eland., Male DOB: 30-Jul-1965, 58 y.o. MRN: [id number] Dominic Schoon. is a 58 y.o. Male who presents today for his Annual Wellness Visit. Patient has history of Hypertension; Recurrent Low Back Pain; Type 2 Diabetes Mellitus w/o Complications; Family History of Prostate Cancer in Father; Obesity and BMI 32.0-32.9, Adult.  History of Hypertension treated with Losartan -HCTZ 100-25 mg daily. Blood Pressure: normotensive today at 130/82.  History of Diabetes Mellitus, type II treated with Metformin  1000 mg twice daily.  08/11/2023 Glucose 171, elevated from 141; HgbA1c 7.1, elevated from 6.6 in 07/2022 which had been trending down to 6.5 in 11/2022 and 6.4 in 01/2023; Microalbumin/Creatinine: WNL. He says that he recently went on a trip to [place] and forgot his Metformin , so hadn't take it during that time. Due for diabetic eye exam.    History of GE Reflux treated with OTC Prilosec 10 mg.   History of Obesity of 242 pounds 1.9 oz w/ BMI 33.30, increased from 234 lbs, 32.18 BMI in 01/2023. At last annual visit last year he weighed 237 pounds, BMI 32.48.  History of Low Back Pain treated with Flexeril  10 mg three times daily as needed.   Labs 08/11/2023 CBC: WNL CMP, compared to 07/2022: Glucose 171, elevated from 141; otherwise WNL.   Lipid Panel, compared to 07/2022: LDL 106, decreased from 128.   Colonoscopy 01/16/2023 found Multiple Large-mouthed Diverticula throughout the entire colon with repeat recommendation of 09/27/32.  Family History of Prostate Cancer in his Father. PSA  0.48  08/11/2023.   Vaccine Counseling: Due for Shingles 1/2 and PNA - discussed; UTD on Flu and Tdap. Past Medical History:  Diagnosis Date    ADD (attention deficit disorder)    Hypertension   Medical/Surgical History Narrative:  Allergic/Intolerant to Penicillin - rash.   42s - Appendectomy  Family History  Problem Relation Age of Onset   Urolithiasis Father   Family History Narrative: Father w/ hx of Kidney Stones and Prostate Cancer Mother, deceased in 09/27/2017 due to Complications of Septicemia, w/ hx of Tremor requiring Electrode Implantation in her brain and Sinemet treatment.  2 Sisters in good health.  Social History   Social History Narrative   Married - wife works as Therapist, music.  No children.  Does not smoke.  Social alcohol consumption.  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   Review of Systems  Constitutional:  Negative for chills, fever, malaise/fatigue and weight loss.  HENT:  Negative for hearing loss, sinus pain and sore throat.   Respiratory:  Negative for cough, hemoptysis and shortness of breath.   Cardiovascular:  Negative for chest pain, palpitations, leg swelling and PND.  Gastrointestinal:  Negative for abdominal pain, constipation, diarrhea, heartburn, nausea and vomiting.  Genitourinary:  Negative for dysuria, frequency and urgency.  Musculoskeletal:  Negative for back pain, myalgias and neck pain.  Skin:  Negative for itching and rash.  Neurological:  Negative for dizziness, tingling, seizures and headaches.  Endo/Heme/Allergies:  Negative for polydipsia.  Psychiatric/Behavioral:  Negative for depression. The patient is not nervous/anxious.     Objective:  Vitals: BP 130/82 (BP Location: Right Arm, Patient Position:  Sitting, Cuff Size: Large)   Pulse 88   Temp (!) 97.3 F (36.3 C) (Tympanic)   Resp 12   Ht 5' 11.5" (1.816 m)   Wt 242 lb 1.9 oz (109.8 kg)   SpO2 92%   BMI 33.30 kg/m  Physical Exam Cardiovascular:     Pulses:          Dorsalis pedis pulses are 2+ on the right side and 2+ on the left side.        Posterior tibial pulses are 2+ on the right side and 2+ on the left side.  Genitourinary:    Prostate: Normal. Not enlarged, not tender and no nodules present.     Rectum: Normal. Guaiac result negative.  Musculoskeletal:     Right foot: No deformity.     Left foot: No deformity.  Feet:     Right foot:     Skin integrity: Skin integrity normal. No ulcer, blister or skin breakdown.     Toenail Condition: Right toenails are normal.     Left foot:     Skin integrity: Skin integrity normal. No ulcer, blister or skin breakdown.     Toenail Condition: Left toenails are normal.   Most Recent Fall Risk Assessment:    08/14/2023    2:12 PM  Fall Risk   Falls in the past year? 0  Number falls in past yr: 0  Injury with Fall? 0  Risk for fall due to : No Fall Risks   Most Recent Depression Screenings:    08/14/2023    2:12 PM 07/17/2022   11:15 AM  PHQ 2/9 Scores  PHQ - 2 Score 0 0  PHQ- 9 Score 0    Results:  Studies Obtained And Personally Reviewed By Me:  Colonoscopy 01/16/2023 found Multiple Large-mouthed Diverticula throughout the entire colon.   Diabetic Foot Exam - Simple   Simple Foot Form Diabetic Foot exam was performed with the following findings: Yes 08/14/2023  2:31 PM  Visual Inspection No deformities, no ulcerations, no other skin breakdown bilaterally: Yes Sensation Testing Intact to touch and monofilament testing bilaterally: Yes Pulse Check Posterior Tibialis and Dorsalis pulse intact bilaterally: Yes Comments    Labs:     Component Value Date/Time   NA 139 08/11/2023 1109   K 4.6 08/11/2023 1109   CL 101 08/11/2023 1109   CO2 29 08/11/2023 1109   GLUCOSE 171 (H) 08/11/2023 1109   BUN 19 08/11/2023 1109   CREATININE 0.92 08/11/2023 1109   CALCIUM 9.7 08/11/2023 1109   PROT 7.5 08/11/2023 1109   ALBUMIN 4.4 10/11/2021 2136   AST 18 08/11/2023 1109   ALT 19 08/11/2023 1109   ALKPHOS 35 (L) 10/11/2021 2136   BILITOT 0.8 08/11/2023 1109   GFRNONAA >60  10/11/2021 2136   GFRNONAA 85 12/30/2019 0931   GFRAA 99 12/30/2019 0931    Lab Results  Component Value Date   WBC 5.3 08/11/2023   HGB 15.4 08/11/2023   HCT 45.1 08/11/2023   MCV 89.3 08/11/2023   PLT 309 08/11/2023   Lab Results  Component Value Date   CHOL 180 08/11/2023   HDL 57 08/11/2023   LDLCALC 106 (H) 08/11/2023   TRIG 84 08/11/2023   CHOLHDL 3.2 08/11/2023   Lab Results  Component Value Date   HGBA1C 7.1 (H) 08/11/2023    Lab Results  Component Value Date   TSH 1.19 11/11/2022    Lab Results  Component Value Date   PSA 0.48  08/11/2023   PSA 0.59 07/14/2022   PSA 0.47 04/25/2021     Assessment & Plan:   Orders Placed This Encounter  Procedures   POCT urinalysis dipstick  Other Labs Reviewed today: CBC: WNL CMP, compared to 07/2022: Glucose 171, elevated from 141; otherwise WNL.   Lipid Panel, compared to 07/2022: LDL 106, decreased from 128.   Hypertension treated with Losartan -HCTZ 100-25 mg daily. Blood Pressure: normotensive today at 130/82.  Diabetes Mellitus, type II treated with Metformin  1000 mg twice daily.  08/11/2023 Glucose 171, elevated from 141; HgbA1c 7.1, elevated from 6.6 in 07/2022 which had been trending down to 6.5 in 11/2022 and 6.4 in 01/2023; Microalbumin/Creatinine: WNL. He says that he recently went on a trip to [place] and forgot his Metformin , so hadn't take it during that time. Due for diabetic eye exam.    GE Reflux treated with OTC Prilosec 10 mg.   Obesity of 242 pounds 1.9 oz w/ BMI 33.30, increased from 234 lbs, 32.18 BMI in 01/2023. At last annual visit last year he weighed 237 pounds, BMI 32.48.  Low Back Pain treated with Flexeril  10 mg three times daily as needed.   Colonoscopy 01/16/2023 found Multiple Large-mouthed Diverticula throughout the entire colon with repeat recommendation of 2034.  Family History of Prostate Cancer in his Father. PSA  0.48  08/11/2023.   Vaccine Counseling: Due for Shingles 1/2 and PNA -  discussed; UTD on Flu and Tdap.   Annual wellness visit done today including the all of the following: Reviewed patient's Family Medical History Reviewed and updated list of patient's medical providers Assessment of cognitive impairment was done Assessed patient's functional ability Established a written schedule for health screening services Health Risk Assessent Completed and Reviewed  Discussed health benefits of physical activity, and encouraged him to engage in regular exercise appropriate for his age and condition.    I,Dominic Garner,acting as a Neurosurgeon for Sylvan Evener, MD.,have documented all relevant documentation on the behalf of Sylvan Evener, MD,as directed by  Sylvan Evener, MD while in the presence of Sylvan Evener, MD.   I, Sylvan Evener, MD, have reviewed all documentation for this visit. The documentation on 09/03/23 for the exam, diagnosis, procedures, and orders are all accurate and complete.

## 2023-08-11 ENCOUNTER — Other Ambulatory Visit: Payer: Self-pay

## 2023-08-11 ENCOUNTER — Other Ambulatory Visit: Payer: 59

## 2023-08-11 DIAGNOSIS — Z0000 Encounter for general adult medical examination without abnormal findings: Secondary | ICD-10-CM

## 2023-08-11 DIAGNOSIS — I10 Essential (primary) hypertension: Secondary | ICD-10-CM | POA: Diagnosis not present

## 2023-08-11 DIAGNOSIS — E7800 Pure hypercholesterolemia, unspecified: Secondary | ICD-10-CM

## 2023-08-11 DIAGNOSIS — E119 Type 2 diabetes mellitus without complications: Secondary | ICD-10-CM | POA: Diagnosis not present

## 2023-08-11 DIAGNOSIS — R8290 Unspecified abnormal findings in urine: Secondary | ICD-10-CM

## 2023-08-11 NOTE — Addendum Note (Signed)
 Addended by: Thelma Barge D on: 08/11/2023 10:08 AM   Modules accepted: Orders

## 2023-08-11 NOTE — Addendum Note (Signed)
 Addended by: Donnamarie Poag on: 08/11/2023 09:41 AM   Modules accepted: Orders

## 2023-08-11 NOTE — Progress Notes (Signed)
 Lab only

## 2023-08-11 NOTE — Addendum Note (Signed)
 Addended by: Thelma Barge D on: 08/11/2023 10:16 AM   Modules accepted: Orders

## 2023-08-11 NOTE — Addendum Note (Signed)
 Addended by: Donnamarie Poag on: 08/11/2023 10:03 AM   Modules accepted: Orders

## 2023-08-12 LAB — CBC WITH DIFFERENTIAL/PLATELET
Absolute Lymphocytes: 1871 {cells}/uL (ref 850–3900)
Absolute Monocytes: 541 {cells}/uL (ref 200–950)
Basophils Absolute: 69 {cells}/uL (ref 0–200)
Basophils Relative: 1.3 %
Eosinophils Absolute: 101 {cells}/uL (ref 15–500)
Eosinophils Relative: 1.9 %
HCT: 45.1 % (ref 38.5–50.0)
Hemoglobin: 15.4 g/dL (ref 13.2–17.1)
MCH: 30.5 pg (ref 27.0–33.0)
MCHC: 34.1 g/dL (ref 32.0–36.0)
MCV: 89.3 fL (ref 80.0–100.0)
MPV: 10.3 fL (ref 7.5–12.5)
Monocytes Relative: 10.2 %
Neutro Abs: 2719 {cells}/uL (ref 1500–7800)
Neutrophils Relative %: 51.3 %
Platelets: 309 10*3/uL (ref 140–400)
RBC: 5.05 10*6/uL (ref 4.20–5.80)
RDW: 12.7 % (ref 11.0–15.0)
Total Lymphocyte: 35.3 %
WBC: 5.3 10*3/uL (ref 3.8–10.8)

## 2023-08-12 LAB — COMPLETE METABOLIC PANEL WITHOUT GFR
AG Ratio: 1.8 (calc) (ref 1.0–2.5)
ALT: 19 U/L (ref 9–46)
AST: 18 U/L (ref 10–35)
Albumin: 4.8 g/dL (ref 3.6–5.1)
Alkaline phosphatase (APISO): 34 U/L — ABNORMAL LOW (ref 35–144)
BUN: 19 mg/dL (ref 7–25)
CO2: 29 mmol/L (ref 20–32)
Calcium: 9.7 mg/dL (ref 8.6–10.3)
Chloride: 101 mmol/L (ref 98–110)
Creat: 0.92 mg/dL (ref 0.70–1.30)
Globulin: 2.7 g/dL (ref 1.9–3.7)
Glucose, Bld: 171 mg/dL — ABNORMAL HIGH (ref 65–99)
Potassium: 4.6 mmol/L (ref 3.5–5.3)
Sodium: 139 mmol/L (ref 135–146)
Total Bilirubin: 0.8 mg/dL (ref 0.2–1.2)
Total Protein: 7.5 g/dL (ref 6.1–8.1)

## 2023-08-12 LAB — LIPID PANEL
Cholesterol: 180 mg/dL (ref <200)
HDL: 57 mg/dL (ref >=40)
LDL Cholesterol (Calc): 106 mg/dL — ABNORMAL HIGH
Non-HDL Cholesterol (Calc): 123 mg/dL (ref <130)
Total CHOL/HDL Ratio: 3.2 (calc) (ref <5.0)
Triglycerides: 84 mg/dL (ref <150)

## 2023-08-12 LAB — MICROALBUMIN / CREATININE URINE RATIO
Creatinine, Urine: 112 mg/dL (ref 20–320)
Microalb Creat Ratio: 4 mg/g{creat} (ref <30)
Microalb, Ur: 0.4 mg/dL

## 2023-08-12 LAB — HEMOGLOBIN A1C
Hgb A1c MFr Bld: 7.1 %{Hb} — ABNORMAL HIGH (ref <5.7)
Mean Plasma Glucose: 157 mg/dL
eAG (mmol/L): 8.7 mmol/L

## 2023-08-12 LAB — PSA: PSA: 0.48 ng/mL (ref <=4.00)

## 2023-08-14 ENCOUNTER — Encounter: Payer: Self-pay | Admitting: Internal Medicine

## 2023-08-14 ENCOUNTER — Ambulatory Visit: Payer: 59 | Admitting: Internal Medicine

## 2023-08-14 VITALS — BP 130/82 | HR 88 | Temp 97.3°F | Resp 12 | Ht 71.5 in | Wt 242.1 lb

## 2023-08-14 DIAGNOSIS — E669 Obesity, unspecified: Secondary | ICD-10-CM | POA: Diagnosis not present

## 2023-08-14 DIAGNOSIS — Z6833 Body mass index (BMI) 33.0-33.9, adult: Secondary | ICD-10-CM

## 2023-08-14 DIAGNOSIS — I10 Essential (primary) hypertension: Secondary | ICD-10-CM

## 2023-08-14 DIAGNOSIS — Z8042 Family history of malignant neoplasm of prostate: Secondary | ICD-10-CM

## 2023-08-14 DIAGNOSIS — E7800 Pure hypercholesterolemia, unspecified: Secondary | ICD-10-CM

## 2023-08-14 DIAGNOSIS — Z0000 Encounter for general adult medical examination without abnormal findings: Secondary | ICD-10-CM | POA: Diagnosis not present

## 2023-08-14 DIAGNOSIS — K219 Gastro-esophageal reflux disease without esophagitis: Secondary | ICD-10-CM

## 2023-08-14 DIAGNOSIS — E119 Type 2 diabetes mellitus without complications: Secondary | ICD-10-CM

## 2023-08-14 DIAGNOSIS — Z7984 Long term (current) use of oral hypoglycemic drugs: Secondary | ICD-10-CM

## 2023-08-14 LAB — POCT URINALYSIS DIP (MANUAL ENTRY)
Bilirubin, UA: NEGATIVE
Blood, UA: NEGATIVE
Glucose, UA: NEGATIVE mg/dL
Ketones, POC UA: NEGATIVE mg/dL
Leukocytes, UA: NEGATIVE
Nitrite, UA: NEGATIVE
Protein Ur, POC: NEGATIVE mg/dL
Spec Grav, UA: 1.015 (ref 1.010–1.025)
Urobilinogen, UA: NEGATIVE U/dL — AB
pH, UA: 6 (ref 5.0–8.0)

## 2023-09-03 ENCOUNTER — Encounter: Payer: Self-pay | Admitting: Internal Medicine

## 2023-09-03 NOTE — Patient Instructions (Signed)
 Please work on diet, exercise and weight loss. Return in 6 months. No change in medications. It was a pleasure to see you today.

## 2023-10-15 ENCOUNTER — Other Ambulatory Visit: Payer: Self-pay | Admitting: Internal Medicine

## 2023-12-28 ENCOUNTER — Telehealth: Payer: Self-pay | Admitting: Internal Medicine

## 2023-12-28 MED ORDER — METFORMIN HCL 500 MG PO TABS: 500.0000 mg | ORAL_TABLET | Freq: Two times a day (BID) | ORAL | 1 refills | Status: DC

## 2023-12-28 NOTE — Telephone Encounter (Signed)
 Refill sent.

## 2024-02-04 IMAGING — CT CT ABD-PELV W/ CM
2 of 3 series · 17 of 46 positions shown, 19 images · IV contrast (agent unspecified)
Comparison: Chest x-ray 10/11/2021

CLINICAL DATA: Epigastric pain

EXAM:
CT ABDOMEN AND PELVIS WITH CONTRAST
TECHNIQUE: Multidetector CT imaging of the abdomen and pelvis was performed
using the standard protocol following bolus administration of
intravenous contrast.

[Series 3: abd/ pelvis 5.0 i30f 2 · axial · 0.83mm/px · z∈[+871,+1331]mm · 14 of 106 slices shown, 16 images]
[im 7/106  soft-tissue]
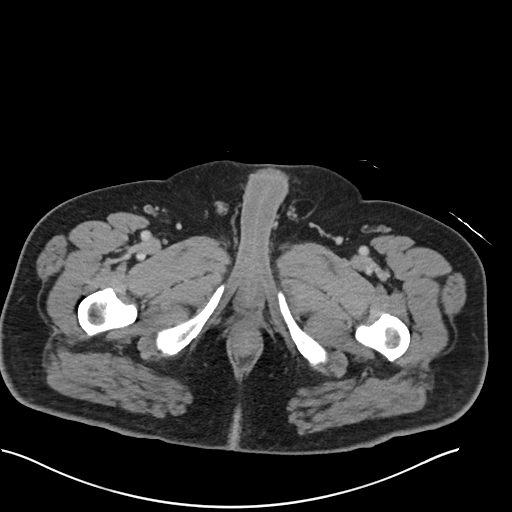
[im 7/106  bone]
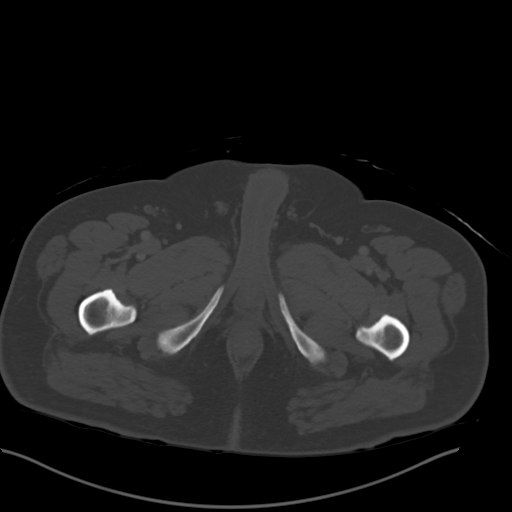
[im 14/106  soft-tissue]
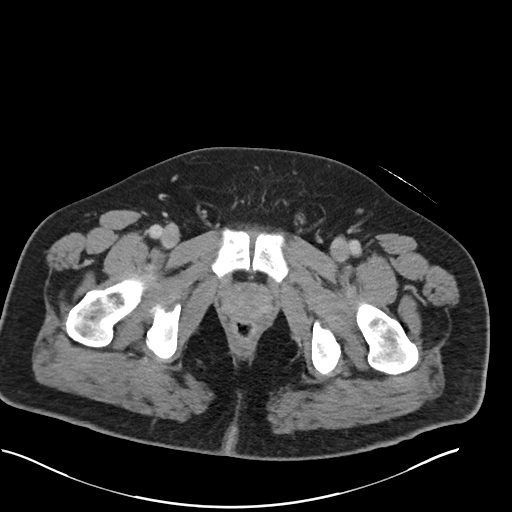
[im 21/106  soft-tissue]
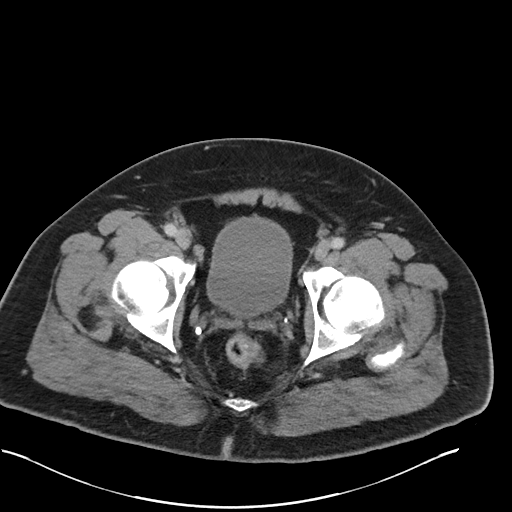
[im 28/106  soft-tissue]
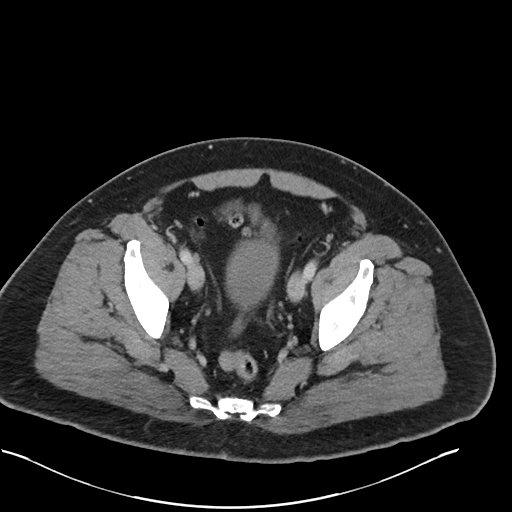
[im 34/106  soft-tissue]
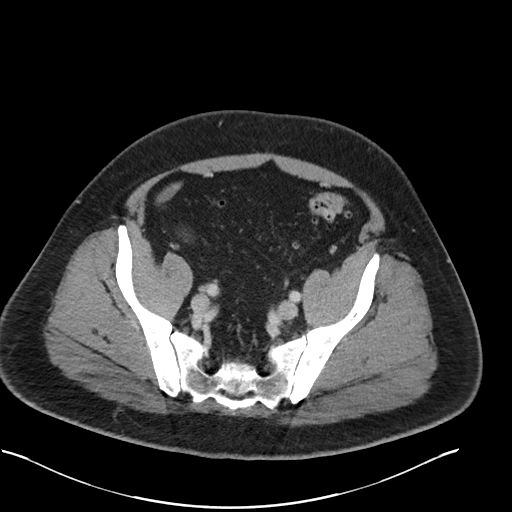
[im 41/106  soft-tissue]
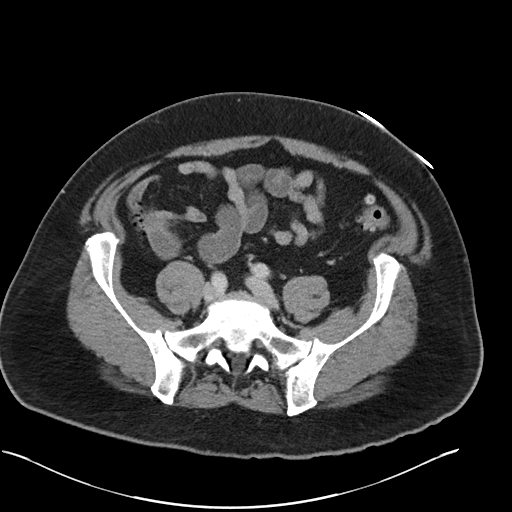
[im 48/106  soft-tissue]
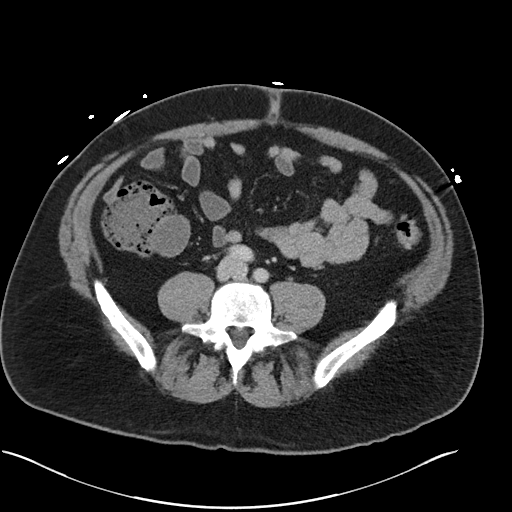
[im 58/106  soft-tissue]
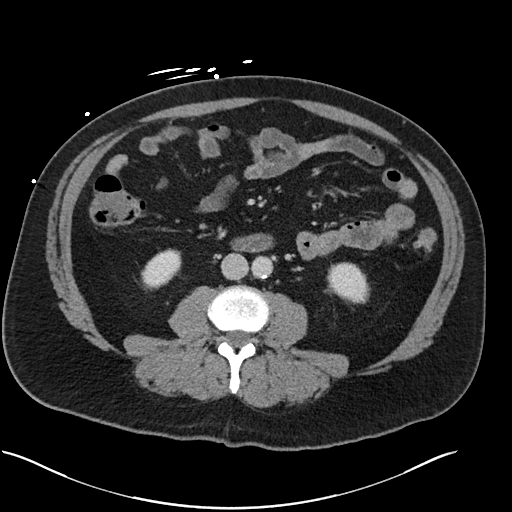
[im 65/106  soft-tissue]
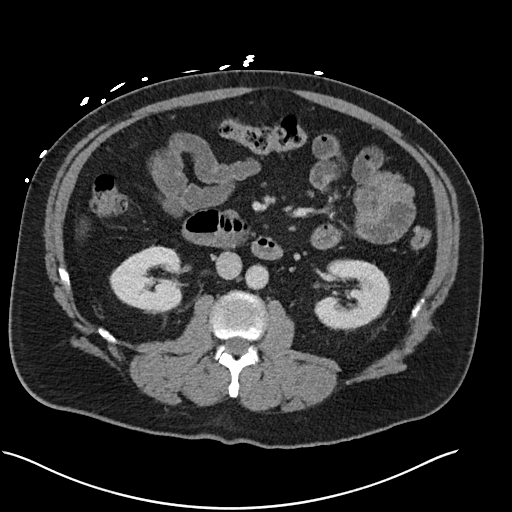
[im 65/106  bone]
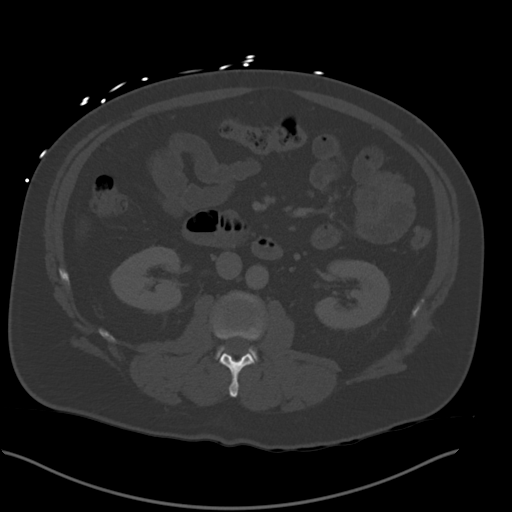
[im 72/106  soft-tissue]
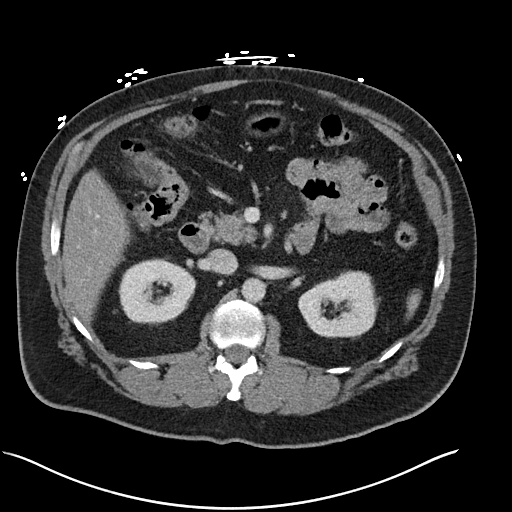
[im 78/106  soft-tissue]
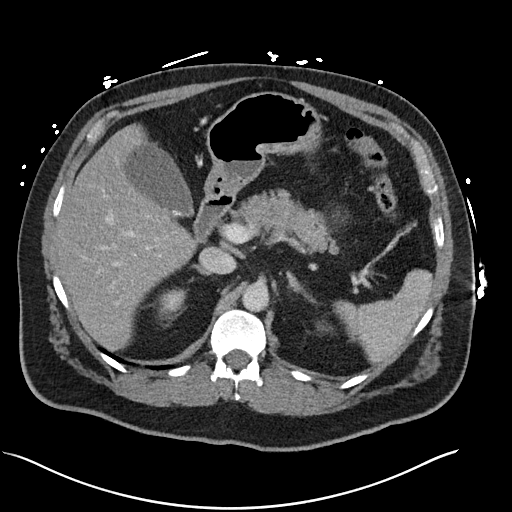
[im 85/106  soft-tissue]
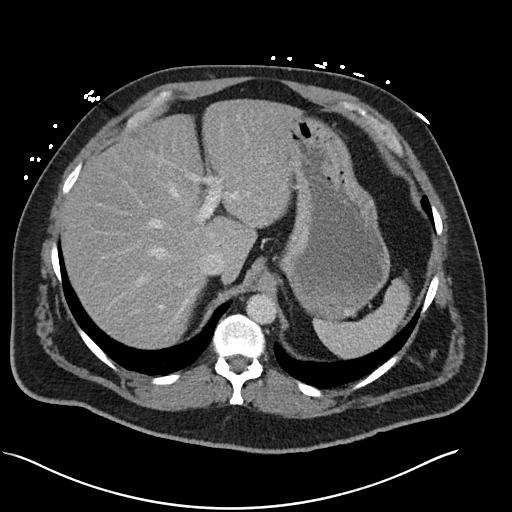
[im 92/106  soft-tissue]
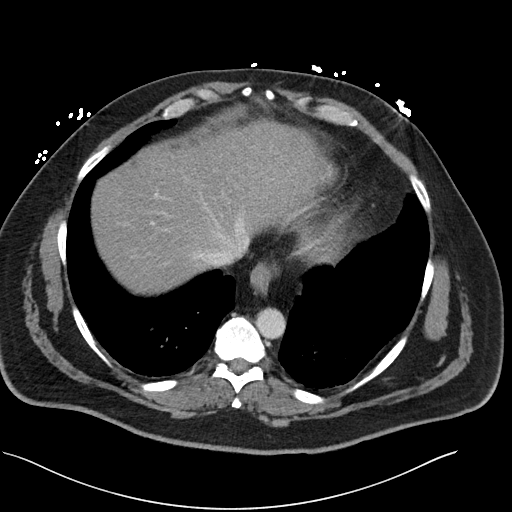
[im 99/106  soft-tissue]
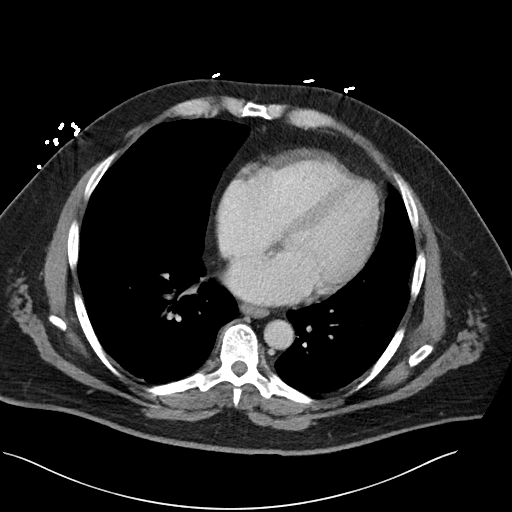

[Series 6: coronal soft tissue · coronal · 0.97mm/px · 3 of 114 slices shown]
[im 38/114  soft-tissue]
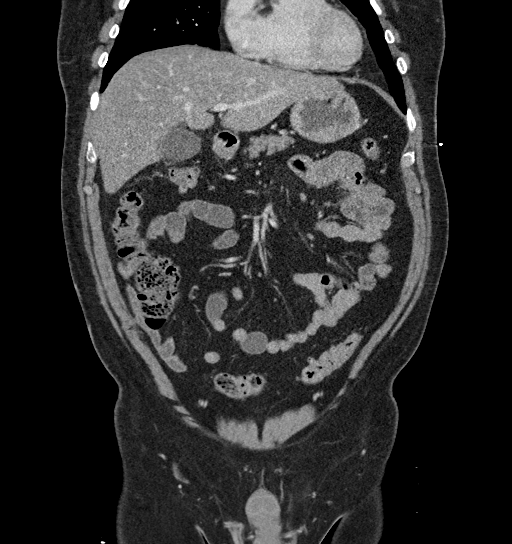
[im 51/114  soft-tissue]
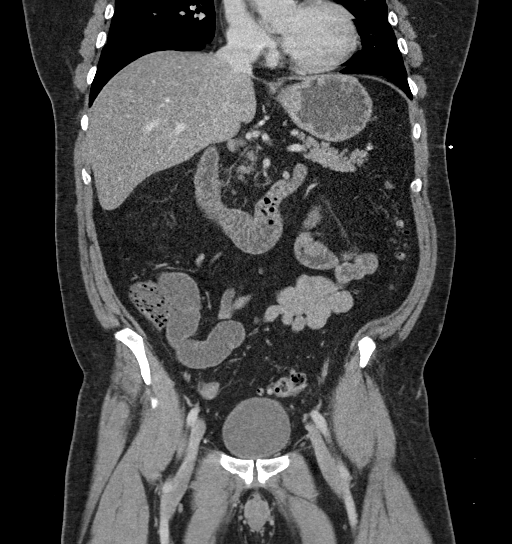
[im 63/114  soft-tissue]
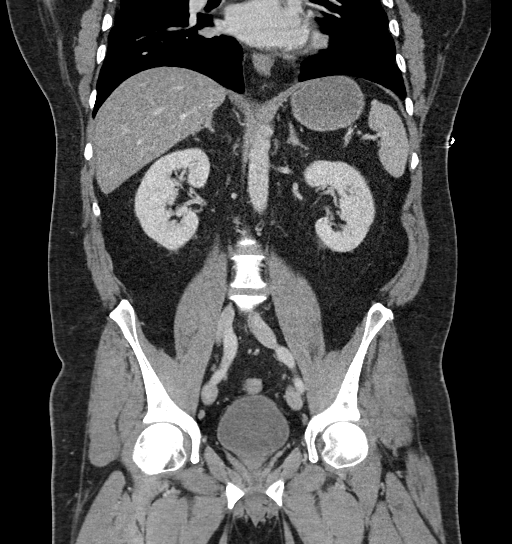

[17 of 46 positions shown; findings below may reference images not displayed]

RADIATION DOSE REDUCTION: This exam was performed according to the
departmental dose-optimization program which includes automated
exposure control, adjustment of the mA and/or kV according to
patient size and/or use of iterative reconstruction technique.

CONTRAST:  100mL OMNIPAQUE IOHEXOL 300 MG/ML  SOLN
FINDINGS: Lower chest: Lung bases demonstrate no acute consolidation or
effusion.

Hepatobiliary: Multiple gallstones. Hepatic steatosis. No biliary
dilatation.

Pancreas: Unremarkable. No pancreatic ductal dilatation or
surrounding inflammatory changes.

Spleen: Normal in size without focal abnormality.

Adrenals/Urinary Tract: Adrenal glands are unremarkable. Kidneys are
normal, without renal calculi, focal lesion, or hydronephrosis.
Bladder is unremarkable.

Stomach/Bowel: Stomach is within normal limits. History of
appendectomy. Diverticular disease of the left colon without acute
wall thickening. No evidence of bowel wall thickening, distention,
or inflammatory changes.

Vascular/Lymphatic: Nonaneurysmal aorta.  No suspicious lymph nodes

Reproductive: Prostate is unremarkable.

Other: Negative for pelvic effusion or free air. Small fat
containing left inguinal hernia

Musculoskeletal: No acute or significant osseous findings.
IMPRESSION: 1. No CT evidence for acute intra-abdominal or pelvic abnormality.
2. Gallstones without right upper quadrant inflammatory process
3. Hepatic steatosis
4. Diverticular disease of the left colon without acute inflammatory
process.

## 2024-02-05 ENCOUNTER — Other Ambulatory Visit

## 2024-02-05 DIAGNOSIS — E7800 Pure hypercholesterolemia, unspecified: Secondary | ICD-10-CM

## 2024-02-05 DIAGNOSIS — E119 Type 2 diabetes mellitus without complications: Secondary | ICD-10-CM

## 2024-02-11 ENCOUNTER — Other Ambulatory Visit

## 2024-02-11 DIAGNOSIS — E7800 Pure hypercholesterolemia, unspecified: Secondary | ICD-10-CM | POA: Diagnosis not present

## 2024-02-11 DIAGNOSIS — E119 Type 2 diabetes mellitus without complications: Secondary | ICD-10-CM

## 2024-02-12 LAB — HEPATIC FUNCTION PANEL
AG Ratio: 1.5 (calc) (ref 1.0–2.5)
ALT: 12 U/L (ref 9–46)
AST: 17 U/L (ref 10–35)
Albumin: 4.5 g/dL (ref 3.6–5.1)
Alkaline phosphatase (APISO): 38 U/L (ref 35–144)
Bilirubin, Direct: 0.2 mg/dL (ref 0.0–0.2)
Globulin: 3 g/dL (ref 1.9–3.7)
Indirect Bilirubin: 0.7 mg/dL (ref 0.2–1.2)
Total Bilirubin: 0.9 mg/dL (ref 0.2–1.2)
Total Protein: 7.5 g/dL (ref 6.1–8.1)

## 2024-02-12 LAB — LIPID PANEL
Cholesterol: 172 mg/dL (ref <200)
HDL: 50 mg/dL (ref >=40)
LDL Cholesterol (Calc): 108 mg/dL — ABNORMAL HIGH
Non-HDL Cholesterol (Calc): 122 mg/dL (ref <130)
Total CHOL/HDL Ratio: 3.4 (calc) (ref <5.0)
Triglycerides: 58 mg/dL (ref <150)

## 2024-02-12 LAB — HEMOGLOBIN A1C
Hgb A1c MFr Bld: 6.4 % — ABNORMAL HIGH (ref <5.7)
Mean Plasma Glucose: 137 mg/dL
eAG (mmol/L): 7.6 mmol/L

## 2024-02-15 NOTE — Progress Notes (Signed)
 Patient Care Team: Perri Ronal PARAS, MD as PCP - General (Internal Medicine) Burnard Debby LABOR, MD (Inactive) as PCP - Cardiology (Cardiology)  Visit Date: 02/16/24  Subjective:    Patient ID: Dominic Garner. , Male   DOB: [birth year], 58 y.o.    MRN: [id number]   58 y.o. Male presents today for 6 month follow up for hypertension and Diabetes Mellitus type II. Patient has a past medical history of GE Reflux, Obesity and Lower back pain.   This past Friday he thought he became dehydrated as he was traveling to the beach and had a lot going on. Symptoms did not last long. He was fatigued but had no chest pain or SOB.  History of Hypertension treated with Losartan -hydrochlorothiazide  100-25 mg daily. Blood pressure today is normal at 130/80.   History of Diabetes Mellitus, Type II treated with Metformin  500 mg twice daily. 02/11/2024 HbgA1c  6.4% decreased from 08/11/2023 7.1%. He said that he got a new prescription and has been taking Metformin  500 mg daily. He said he has been exercising and watching his weight.   History of GE reflux treated with OTC Prilosec.   History of Obesity, Current weight 237 pounds BMI 32.59. At his annual visit last year he weighed 237 pounds, BMI 32.48.   History of Lower back pain treated with Flexeril  10 mg three times daily as needed.  No recent episodes of back pain.  Labs 02/11/2024 LDL 108, HgbA1c 6.4%, Otherwise WNL.    Past Medical History:  Diagnosis Date   ADD (attention deficit disorder)    Hypertension     Family History  Problem Relation Age of Onset   Urolithiasis Father     Social History   Social History Narrative   Married - wife works as Therapist, music.  No children.  Does not smoke.  Social alcohol consumption.  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      Review of Systems  All other systems reviewed and are negative.       Objective:    Vitals: BP 130/80   Pulse 87   Ht 5' 11.5 (1.816 m)   Wt 237 lb (107.5 kg)   SpO2 98%   BMI 32.59 kg/m    Physical Exam Constitutional:      General: He is not in acute distress.    Appearance: Normal appearance. He is not ill-appearing.  HENT:     Head: Normocephalic and atraumatic.  Neck:     Vascular: No carotid bruit.  Cardiovascular:     Rate and Rhythm: Normal rate and regular rhythm.     Pulses: Normal pulses.     Heart sounds: Normal heart sounds. No murmur heard.    No friction rub. No gallop.  Pulmonary:     Effort: Pulmonary effort is normal. No respiratory distress.     Breath sounds: Normal breath sounds. No wheezing or rales.  Skin:    General: Skin is warm and dry.  Neurological:     Mental Status: He is alert and oriented to person, place, and time. Mental status is at baseline.  Psychiatric:        Mood and Affect: Mood normal.        Behavior: Behavior normal.        Thought Content: Thought content normal.        Judgment: Judgment normal.       Results:  Labs:       Component Value Date/Time   NA 139 08/11/2023 1109   K 4.6 08/11/2023 1109   CL 101 08/11/2023 1109   CO2 29 08/11/2023 1109   GLUCOSE 171 (H) 08/11/2023 1109   BUN 19 08/11/2023 1109   CREATININE 0.92 08/11/2023 1109   CALCIUM 9.7 08/11/2023 1109   PROT 7.5 02/11/2024 1013   ALBUMIN 4.4 10/11/2021 2136   AST 17 02/11/2024 1013   ALT 12 02/11/2024 1013   ALKPHOS 35 (L) 10/11/2021 2136   BILITOT 0.9 02/11/2024 1013   GFRNONAA >60 10/11/2021 2136   GFRNONAA 85 12/30/2019 0931   GFRAA 99 12/30/2019 0931     Lab Results  Component Value Date   WBC 5.3 08/11/2023   HGB 15.4 08/11/2023   HCT 45.1 08/11/2023   MCV 89.3 08/11/2023   PLT 309 08/11/2023    Lab Results  Component Value Date   CHOL 172 02/11/2024   HDL 50 02/11/2024   LDLCALC 108 (H) 02/11/2024   TRIG 58 02/11/2024   CHOLHDL 3.4 02/11/2024    Lab Results  Component Value Date   HGBA1C 6.4  (H) 02/11/2024     Lab Results  Component Value Date   TSH 1.19 11/11/2022     Lab Results  Component Value Date   PSA 0.48 08/11/2023   PSA 0.59 07/14/2022   PSA 0.47 04/25/2021      Assessment & Plan:   Hypertension: treated with Losartan -hydrochlorothiazide  100-25 mg daily. Blood pressure today is 130/80. Continue same medication and monitor BP more frequently.  Diabetes Mellitus, Type II: treated with Metformin  1000 mg twice daily. 02/11/2024 HbgA1c  6.4% decreased from 08/11/2023 7.1%. He said that he got a new prescription and has been taking Metformin  500 mg daily. He said he was been exercising and watching his weight. I think twice daily dosage is better for him.  GE reflux: treated with OTC Prilosec and he reports good control of reflux symptoms with this.  Obesity: Current weight 237 pounds BMI 32.59. At his annual visit last year he weighed 237 pounds, BMI 32.48.   Lower back pain: treated with Flexeril  10 mg three times daily as needed.  No recent episodes of severe back pain.  Elevated LDL has improved from 128 in 2024 to 108.Coronary calcium scoring was 0 in 2023.  I,Makayla C Reid,acting as a scribe for Ronal JINNY Hailstone, MD.,have documented all relevant documentation on the behalf of Ronal JINNY Hailstone, MD,as directed by  Ronal JINNY Hailstone, MD while in the presence of Ronal JINNY Hailstone, MD.   I, Ronal JINNY Hailstone, MD, have reviewed all documentation for this visit. The documentation on 02/16/2024 for the exam, diagnosis, procedures, and orders are all accurate and complete.

## 2024-02-16 ENCOUNTER — Encounter: Payer: Self-pay | Admitting: Internal Medicine

## 2024-02-16 ENCOUNTER — Ambulatory Visit: Admitting: Internal Medicine

## 2024-02-16 ENCOUNTER — Telehealth: Payer: Self-pay | Admitting: Internal Medicine

## 2024-02-16 VITALS — BP 130/80 | HR 87 | Ht 71.5 in | Wt 237.0 lb

## 2024-02-16 DIAGNOSIS — I10 Essential (primary) hypertension: Secondary | ICD-10-CM

## 2024-02-16 DIAGNOSIS — Z7985 Long-term (current) use of injectable non-insulin antidiabetic drugs: Secondary | ICD-10-CM

## 2024-02-16 DIAGNOSIS — Z6832 Body mass index (BMI) 32.0-32.9, adult: Secondary | ICD-10-CM

## 2024-02-16 DIAGNOSIS — K219 Gastro-esophageal reflux disease without esophagitis: Secondary | ICD-10-CM

## 2024-02-16 DIAGNOSIS — E119 Type 2 diabetes mellitus without complications: Secondary | ICD-10-CM | POA: Diagnosis not present

## 2024-02-16 MED ORDER — METFORMIN HCL 500 MG PO TABS: 500.0000 mg | ORAL_TABLET | Freq: Two times a day (BID) | ORAL | 1 refills | Status: AC

## 2024-02-16 NOTE — Telephone Encounter (Signed)
 Refill sent.

## 2024-02-17 NOTE — Patient Instructions (Addendum)
 It was a pleasure to see you today. Please continue same medications and return in 6 month for annual wellness visit. Continue diet and exercise efforts. He has declined statin medication despite hyperlipidemia. However, his coronary calcium score was 0.

## 2024-02-19 ENCOUNTER — Ambulatory Visit: Admitting: Internal Medicine

## 2024-04-21 ENCOUNTER — Other Ambulatory Visit: Payer: Self-pay | Admitting: Internal Medicine

## 2024-07-28 ENCOUNTER — Other Ambulatory Visit: Payer: Self-pay

## 2024-08-02 ENCOUNTER — Encounter: Payer: Self-pay | Admitting: Internal Medicine
